# Patient Record
Sex: Female | Born: 1975
Health system: Southern US, Community
[De-identification: ages and names within clinical notes are randomized; demographics above are authoritative.]

## PROBLEM LIST (undated history)

## (undated) DIAGNOSIS — Z720 Tobacco use: Secondary | ICD-10-CM

## (undated) DIAGNOSIS — I10 Essential (primary) hypertension: Secondary | ICD-10-CM

## (undated) DIAGNOSIS — I639 Cerebral infarction, unspecified: Secondary | ICD-10-CM

## (undated) HISTORY — PX: WISDOM TOOTH EXTRACTION: SHX21

---

## 2002-05-26 ENCOUNTER — Inpatient Hospital Stay (HOSPITAL_COMMUNITY): Admission: AD | Admit: 2002-05-26 | Discharge: 2002-05-26 | Payer: Self-pay | Admitting: Obstetrics and Gynecology

## 2003-04-18 ENCOUNTER — Inpatient Hospital Stay (HOSPITAL_COMMUNITY): Admission: AD | Admit: 2003-04-18 | Discharge: 2003-04-18 | Payer: Self-pay | Admitting: Obstetrics & Gynecology

## 2003-07-06 ENCOUNTER — Encounter: Admission: RE | Admit: 2003-07-06 | Discharge: 2003-07-06 | Payer: Self-pay | Admitting: *Deleted

## 2003-07-07 ENCOUNTER — Ambulatory Visit (HOSPITAL_COMMUNITY): Admission: RE | Admit: 2003-07-07 | Discharge: 2003-07-07 | Payer: Self-pay | Admitting: *Deleted

## 2003-07-07 IMAGING — US US OB COMP +14 WK
1 series · 7 of 7 positions shown · non-contrast
Comparison: none

CLINICAL DATA: Anatomic exam.  No current problems.

[Series 1: unknown · 0.14mm/px · 7 of 7 slices shown]
[im 1/7]
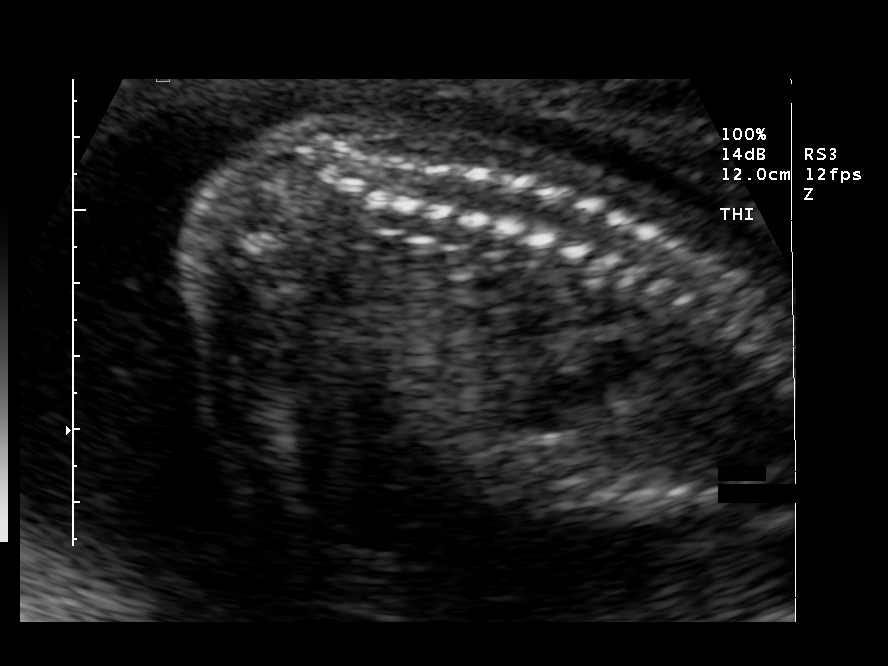
[im 2/7]
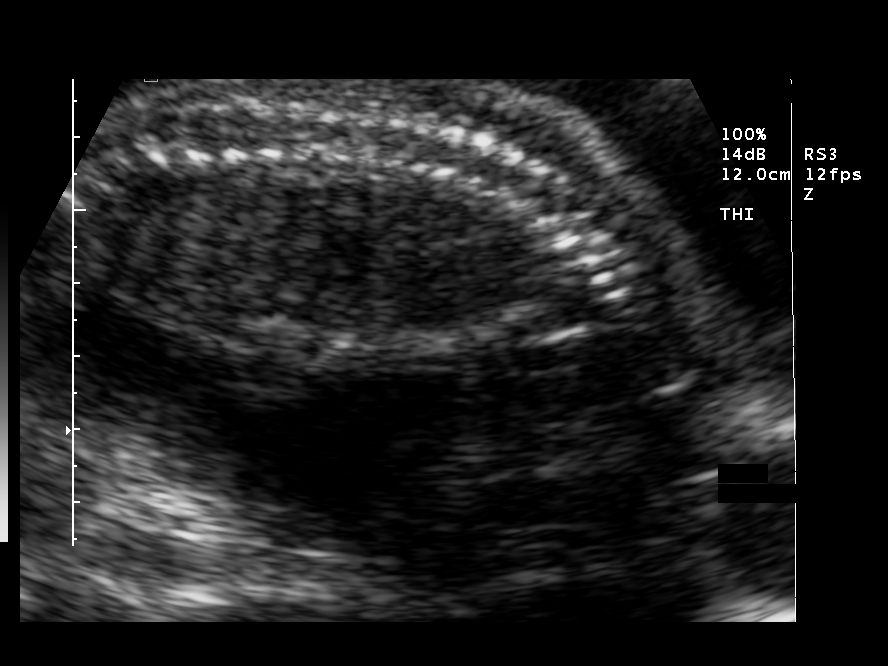
[im 3/7]
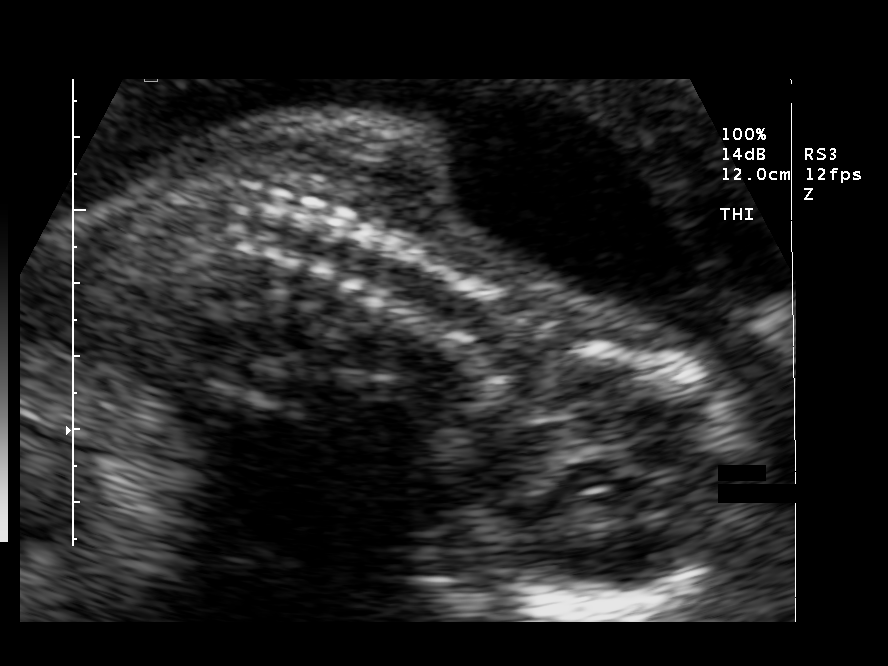
[im 4/7]
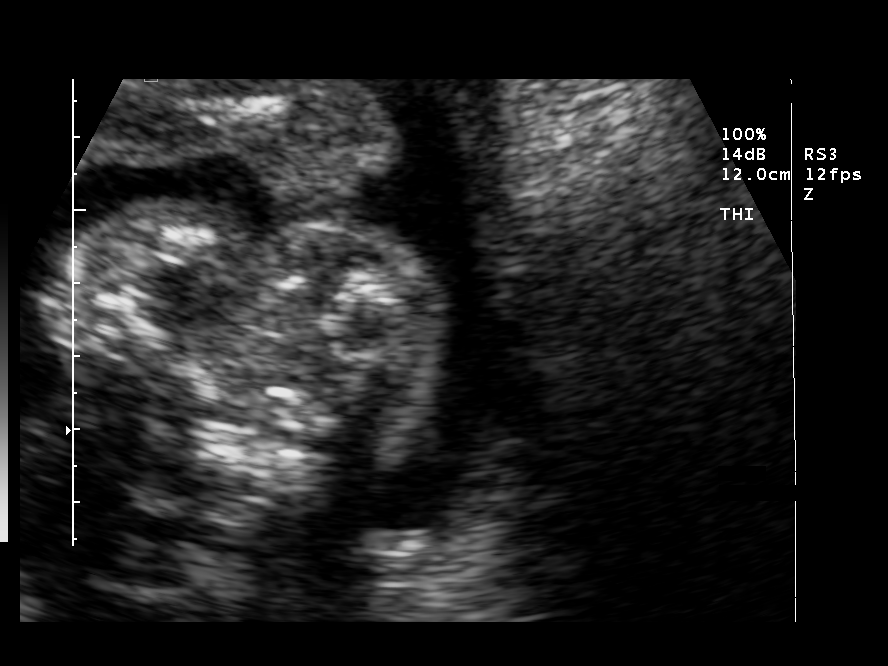
[im 5/7]
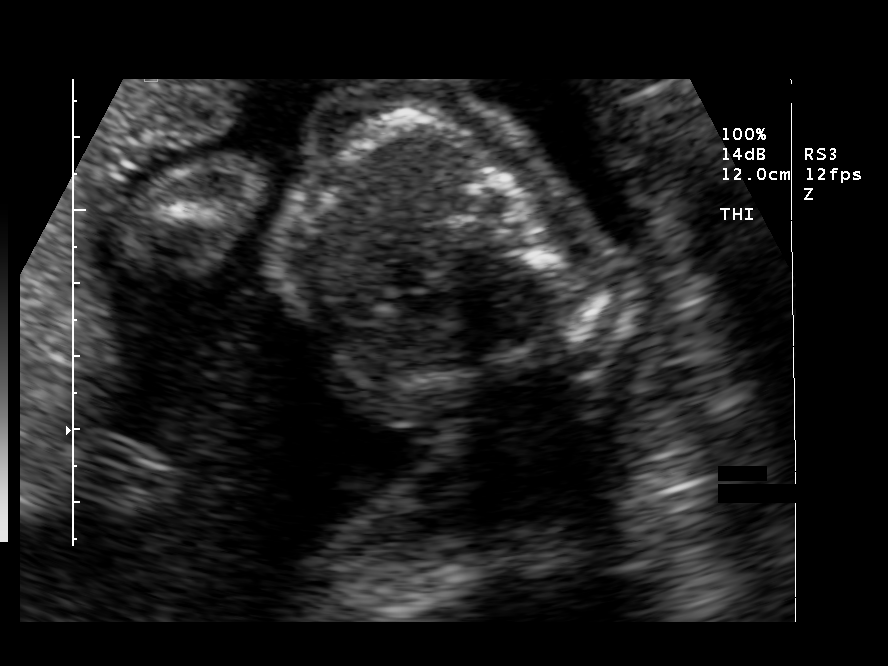
[im 6/7]
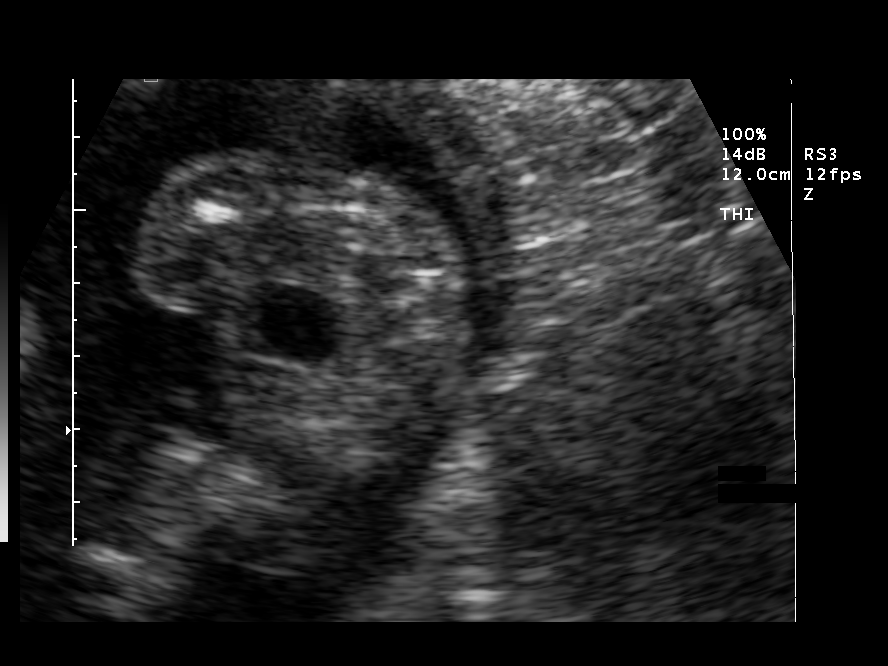
[im 7/7]
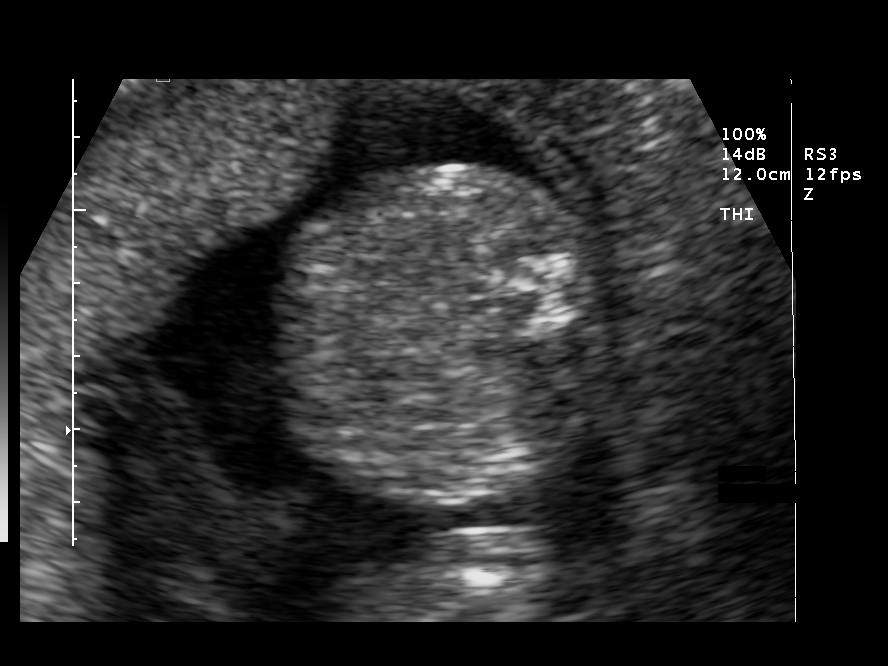

[7 of 7 positions shown; findings below may reference images not displayed]

OBSTETRICAL ULTRASOUND
Number of Fetuses:  1
Heart Rate:  147
Movement:  Yes
Breathing:    No  
Presentation:  Cephalic
Placental Location:  Anterior
Grade:  I
Previa:  No
Amniotic Fluid (Subjective):  Normal
Amniotic Fluid (Objective):   5.3 cm Vertical pocket 

FETAL BIOMETRY
BPD:  4.8 cm   20 w 4 d
HC:  18.2 cm  20 w 4 d
AC:  15.5 cm  20 w 4 d
FL:    3.4 cm  20 w 4 d

MEAN GA:  20 w 4 d

FETAL ANATOMY
Lateral Ventricles:    Visualized 
Thalami/CSP:      Visualized 
Posterior Fossa:  Visualized 
Nuchal Region:    N/A
Spine:      Visualized 
4 Chamber Heart on Left:      Visualized 
Stomach on Left:      Visualized 
3 Vessel Cord:    Visualized 
Cord Insertion site:    Visualized 
Kidneys:  Visualized 
Bladder:  Visualized 
Extremities:      Visualized 

ADDITIONAL ANATOMY VISUALIZED:  LVOT, RVOT, upper lip, orbits, profile, diaphragm, heel, 5th digit, ductal arch, aortic arch, and male genitalia

MATERNAL FINDINGS
Cervix:   3.1 cm Transabdominally
IMPRESSION: Single intrauterine pregnancy demonstrating an estimated gestational age by ultrasound of 20 weeks and 4 days.  Correlation with assigned gestational age by LMP of 20 weeks and 6 days suggests appropriate growth.  
No focal fetal or placental abnormalities are noted with a good anatomic exam possible.  

</u12:p>

## 2003-07-07 IMAGING — US US OB COMP +14 WK
1 series · 13 of 28 positions shown · non-contrast
Comparison: none

CLINICAL DATA: Anatomic exam.  No current problems.

[Series 1: unknown · 0.28mm/px · 13 of 76 slices shown]
[im 3/76]
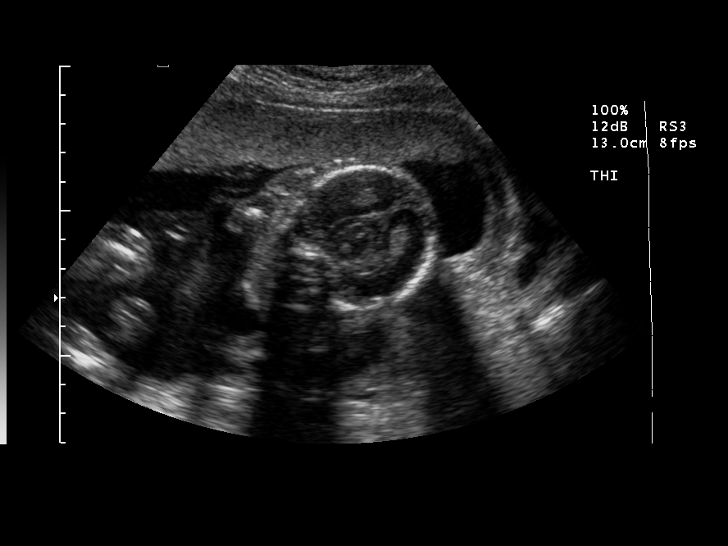
[im 9/76]
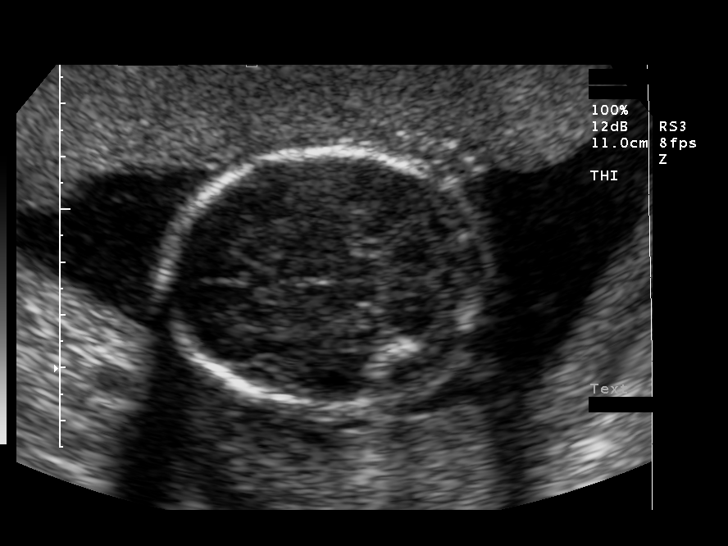
[im 14/76]
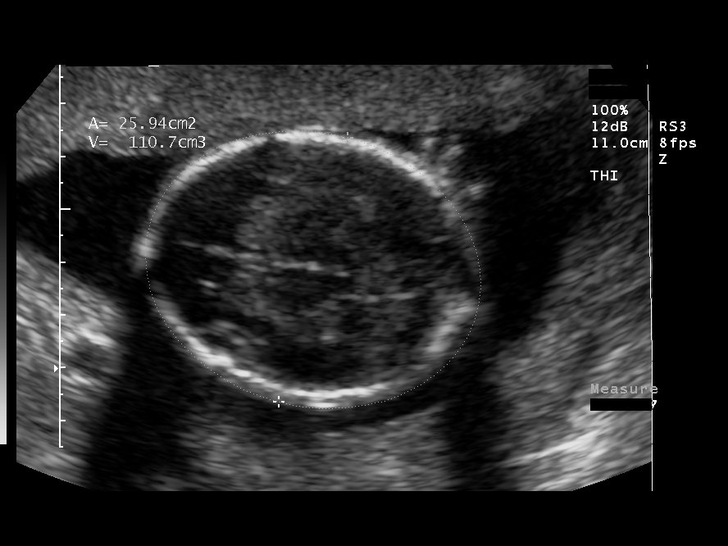
[im 20/76]
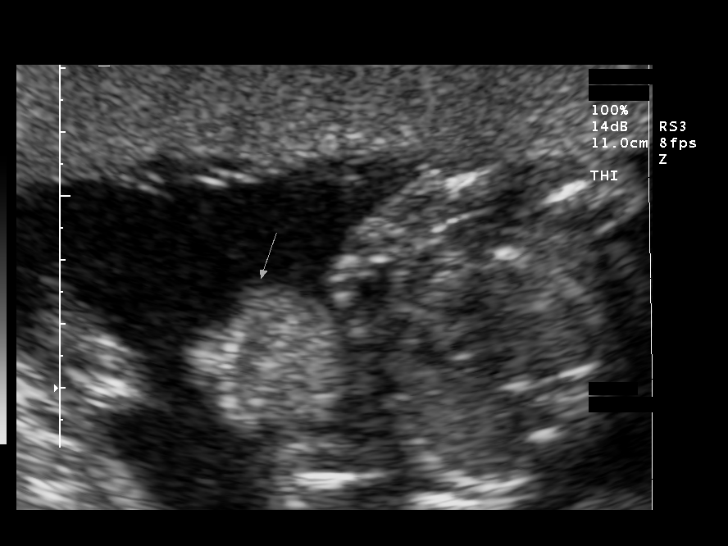
[im 26/76]
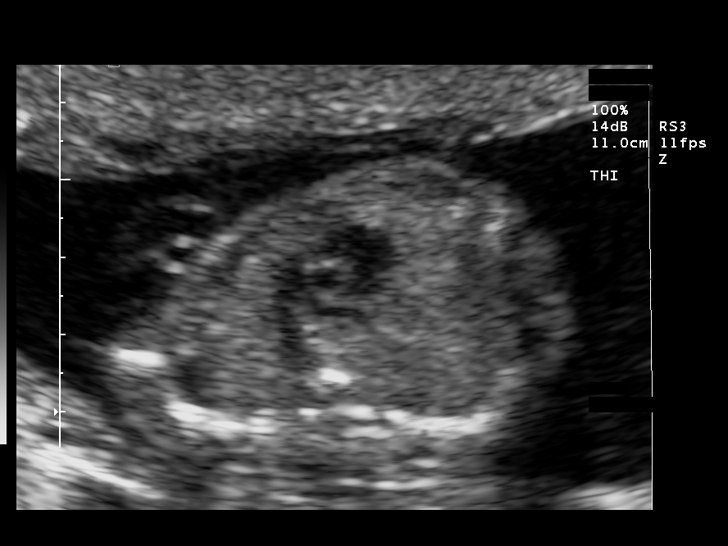
[im 31/76]
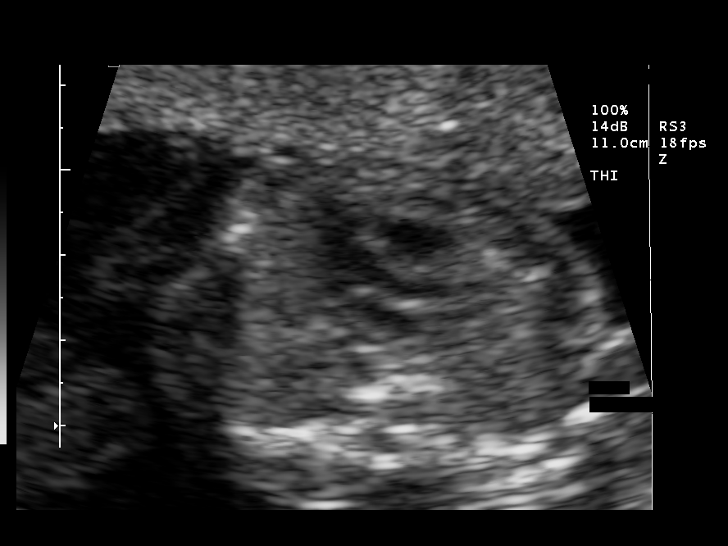
[im 39/76]
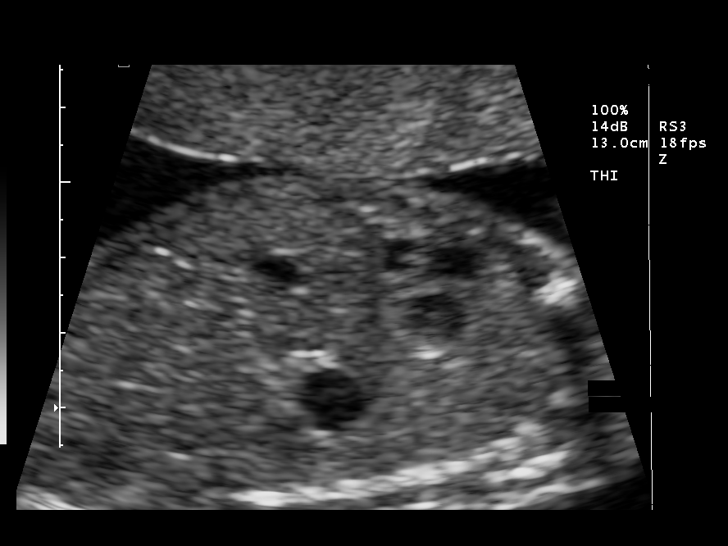
[im 45/76]
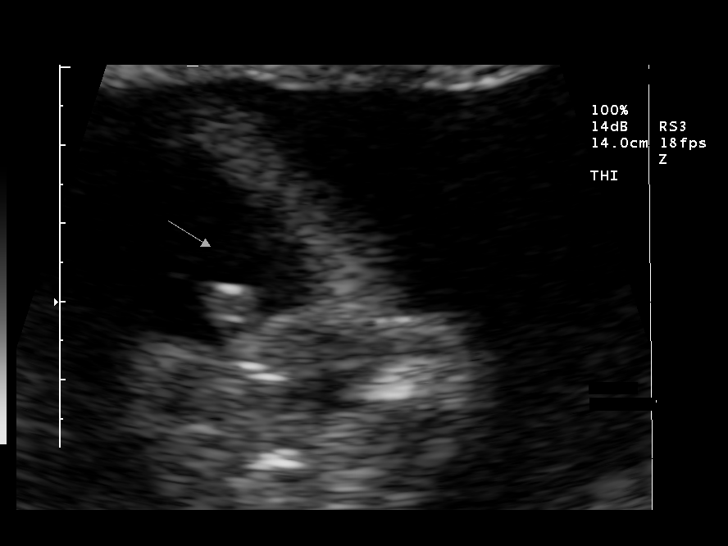
[im 51/76]
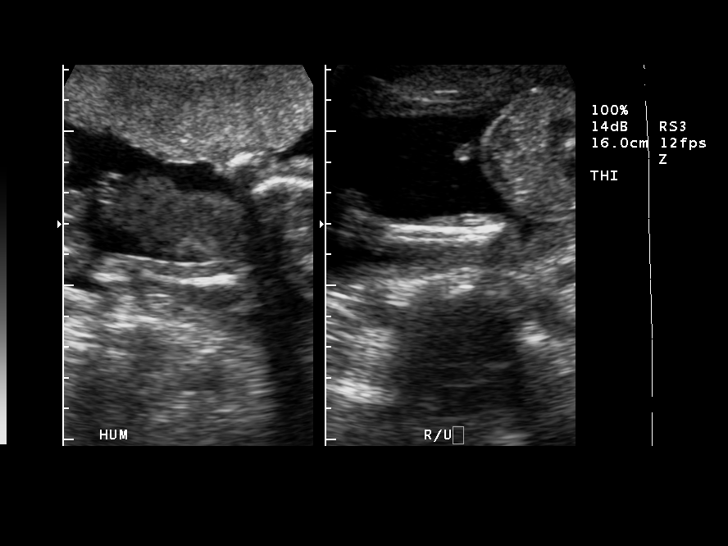
[im 56/76]
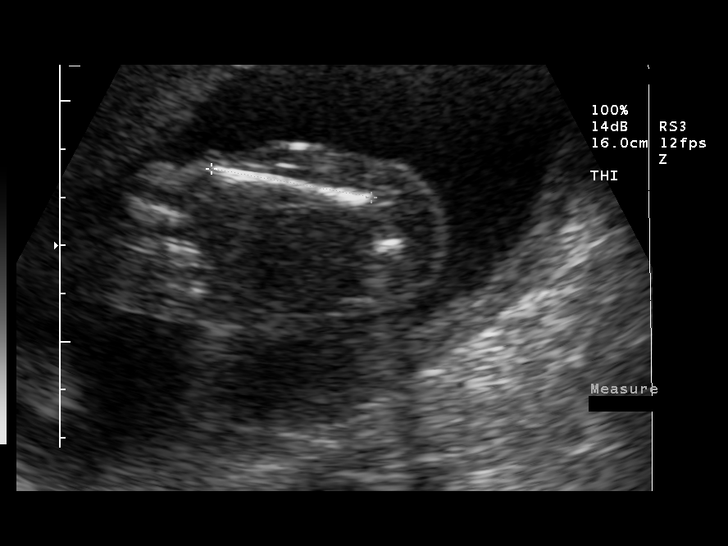
[im 62/76]
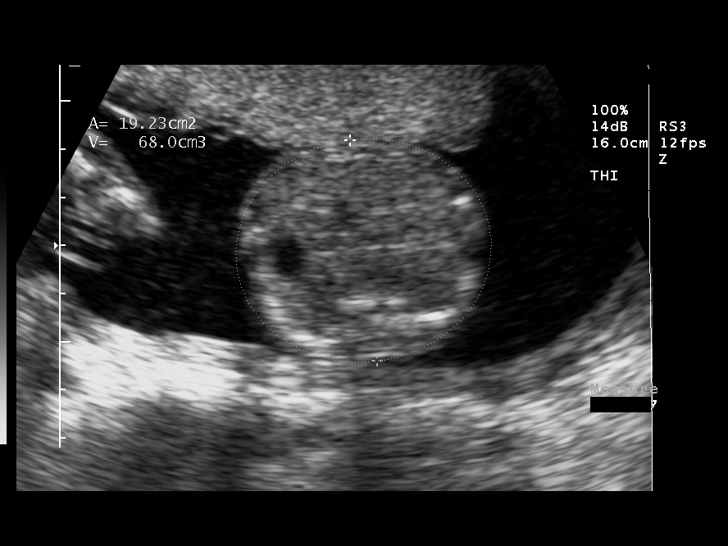
[im 67/76]
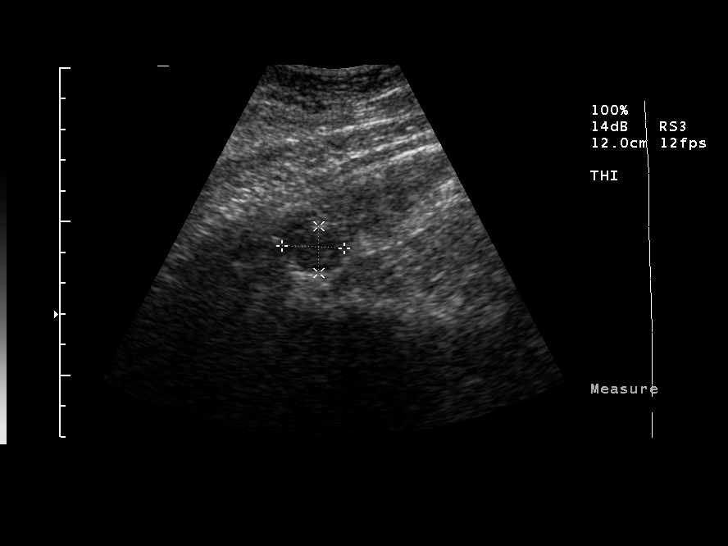
[im 73/76]
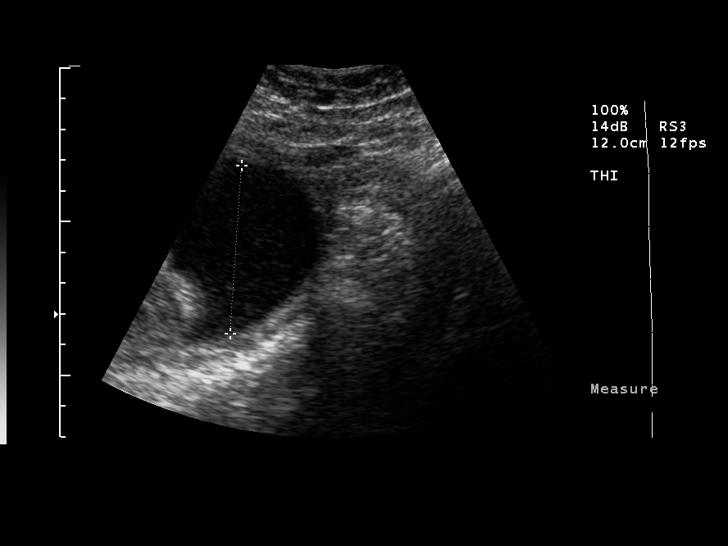

[13 of 28 positions shown; findings below may reference images not displayed]

OBSTETRICAL ULTRASOUND
Number of Fetuses:  1
Heart Rate:  147
Movement:  Yes
Breathing:    No  
Presentation:  Cephalic
Placental Location:  Anterior
Grade:  I
Previa:  No
Amniotic Fluid (Subjective):  Normal
Amniotic Fluid (Objective):   5.3 cm Vertical pocket 

FETAL BIOMETRY
BPD:  4.8 cm   20 w 4 d
HC:  18.2 cm  20 w 4 d
AC:  15.5 cm  20 w 4 d
FL:    3.4 cm  20 w 4 d

MEAN GA:  20 w 4 d

FETAL ANATOMY
Lateral Ventricles:    Visualized 
Thalami/CSP:      Visualized 
Posterior Fossa:  Visualized 
Nuchal Region:    N/A
Spine:      Visualized 
4 Chamber Heart on Left:      Visualized 
Stomach on Left:      Visualized 
3 Vessel Cord:    Visualized 
Cord Insertion site:    Visualized 
Kidneys:  Visualized 
Bladder:  Visualized 
Extremities:      Visualized 

ADDITIONAL ANATOMY VISUALIZED:  LVOT, RVOT, upper lip, orbits, profile, diaphragm, heel, 5th digit, ductal arch, aortic arch, and male genitalia

MATERNAL FINDINGS
Cervix:   3.1 cm Transabdominally
IMPRESSION: Single intrauterine pregnancy demonstrating an estimated gestational age by ultrasound of 20 weeks and 4 days.  Correlation with assigned gestational age by LMP of 20 weeks and 6 days suggests appropriate growth.  
No focal fetal or placental abnormalities are noted with a good anatomic exam possible.  

</u12:p>

## 2003-08-02 ENCOUNTER — Inpatient Hospital Stay (HOSPITAL_COMMUNITY): Admission: AD | Admit: 2003-08-02 | Discharge: 2003-08-02 | Payer: Self-pay | Admitting: *Deleted

## 2003-08-04 ENCOUNTER — Encounter: Admission: RE | Admit: 2003-08-04 | Discharge: 2003-08-04 | Payer: Self-pay | Admitting: *Deleted

## 2003-08-25 ENCOUNTER — Encounter: Admission: RE | Admit: 2003-08-25 | Discharge: 2003-08-25 | Payer: Self-pay | Admitting: Family Medicine

## 2003-08-31 ENCOUNTER — Encounter: Admission: RE | Admit: 2003-08-31 | Discharge: 2003-08-31 | Payer: Self-pay | Admitting: *Deleted

## 2003-09-21 ENCOUNTER — Ambulatory Visit: Payer: Self-pay | Admitting: *Deleted

## 2003-09-22 ENCOUNTER — Inpatient Hospital Stay (HOSPITAL_COMMUNITY): Admission: AD | Admit: 2003-09-22 | Discharge: 2003-09-22 | Payer: Self-pay | Admitting: *Deleted

## 2003-09-26 ENCOUNTER — Ambulatory Visit (HOSPITAL_COMMUNITY): Admission: RE | Admit: 2003-09-26 | Discharge: 2003-09-26 | Payer: Self-pay | Admitting: *Deleted

## 2003-09-26 IMAGING — US US OB FOLLOW-UP
1 series · 18 of 28 positions shown · non-contrast
Comparison: none

CLINICAL DATA: Assess growth.

[Series 1: us ob re-eval · 18 of 40 slices shown]
[im 1/40]
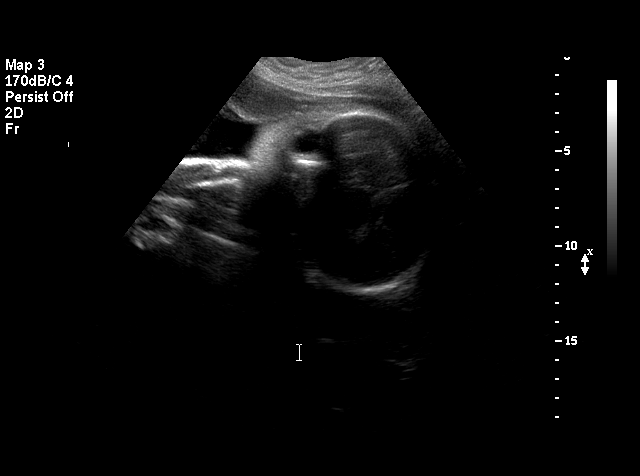
[im 3/40]
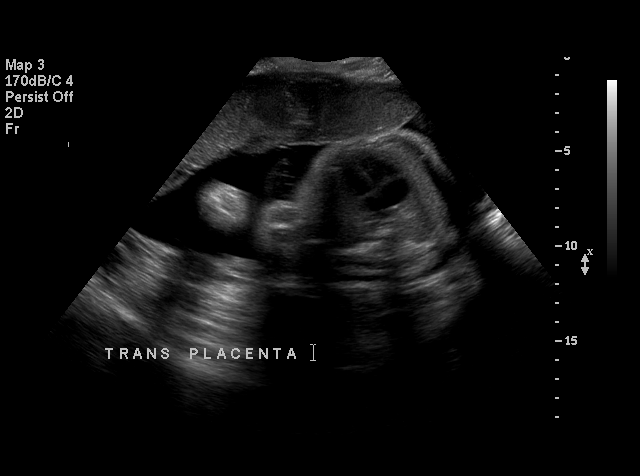
[im 5/40]
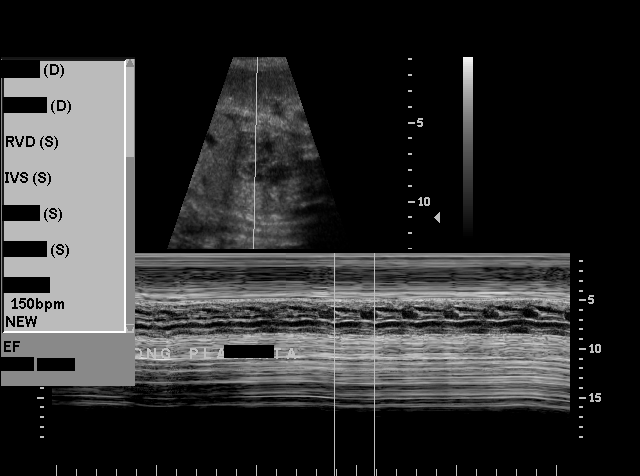
[im 8/40]
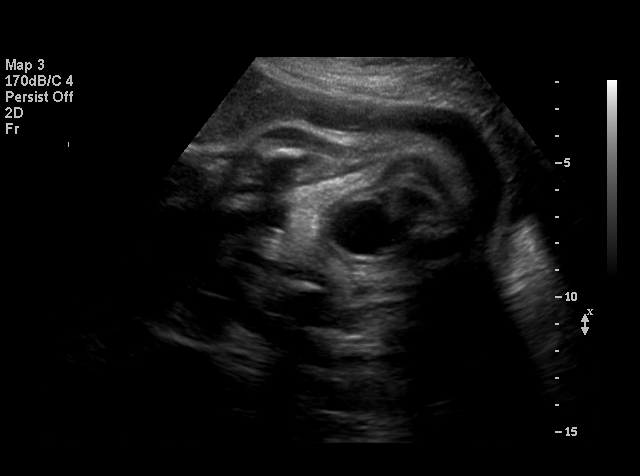
[im 11/40]
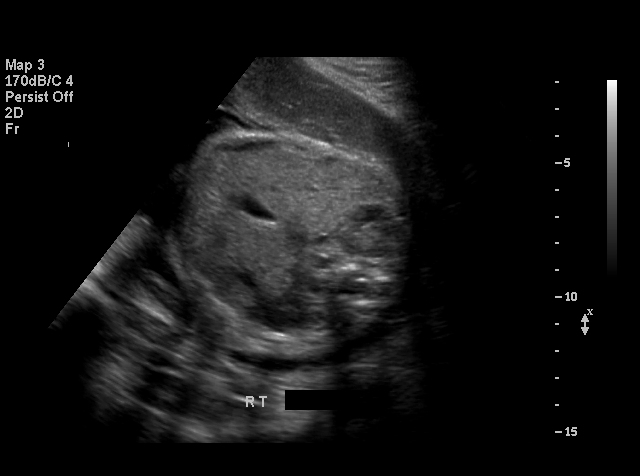
[im 12/40]
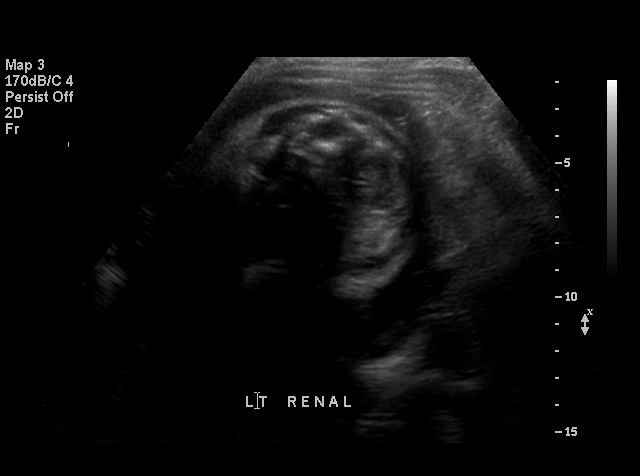
[im 15/40]
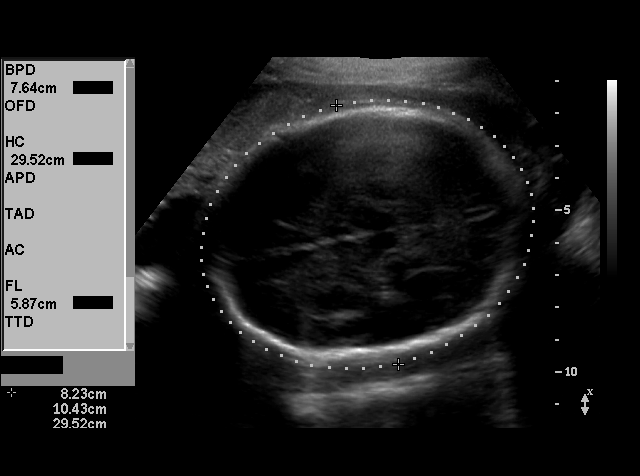
[im 16/40]
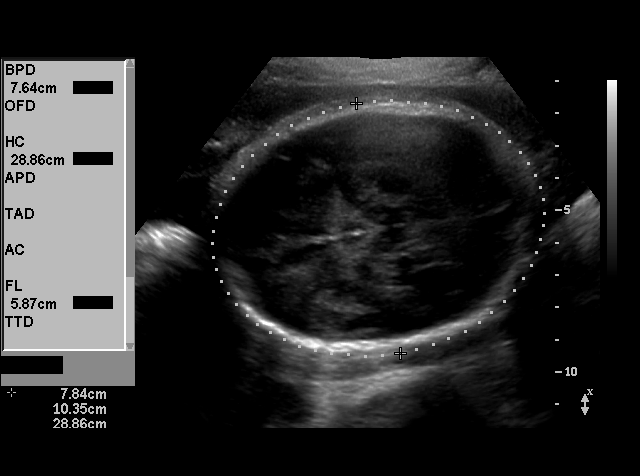
[im 19/40]
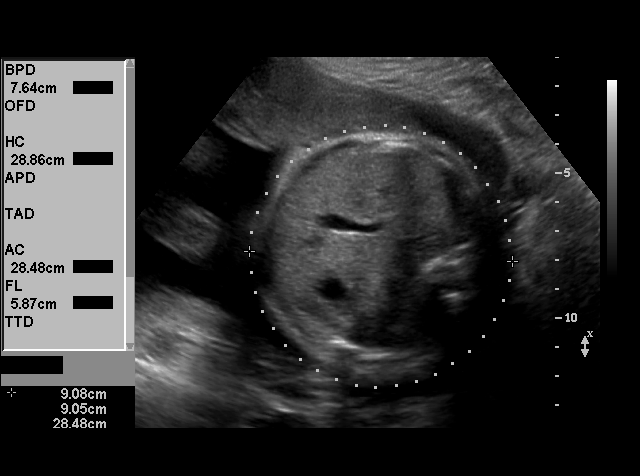
[im 21/40]
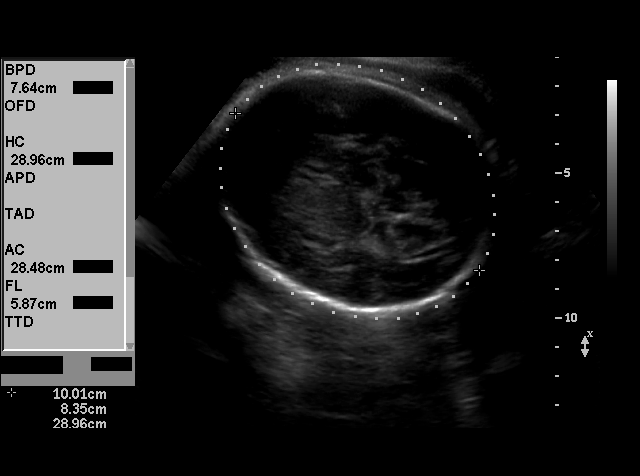
[im 24/40]
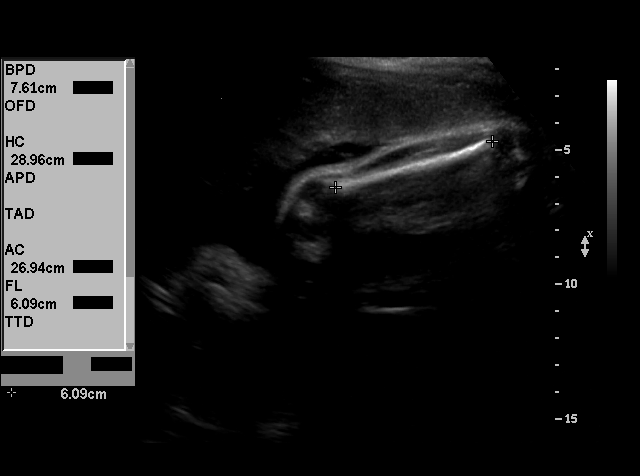
[im 25/40]
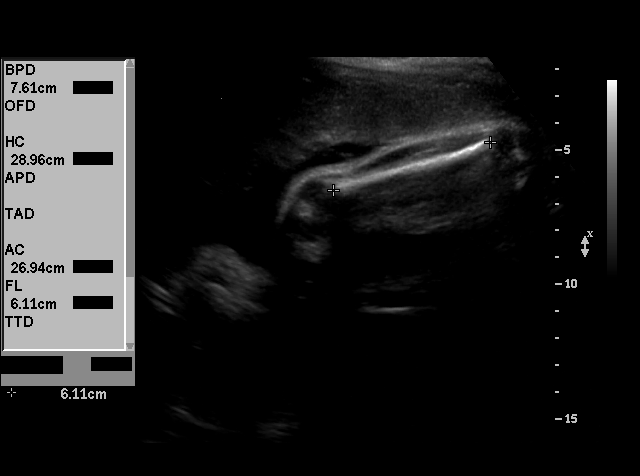
[im 28/40]
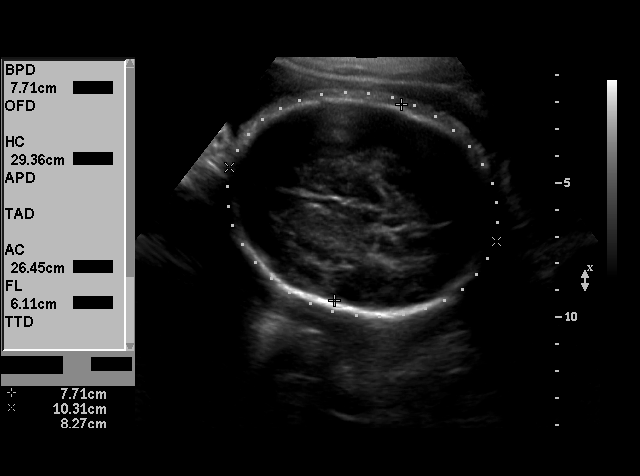
[im 31/40]
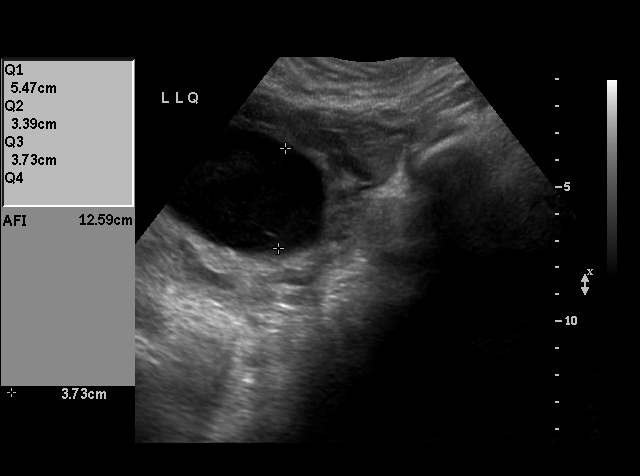
[im 32/40]
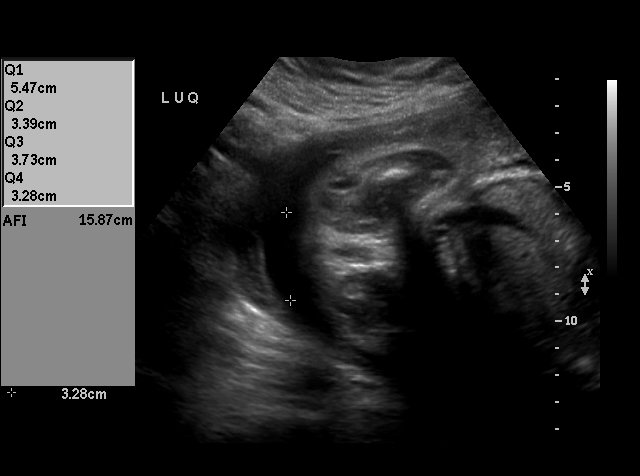
[im 35/40]
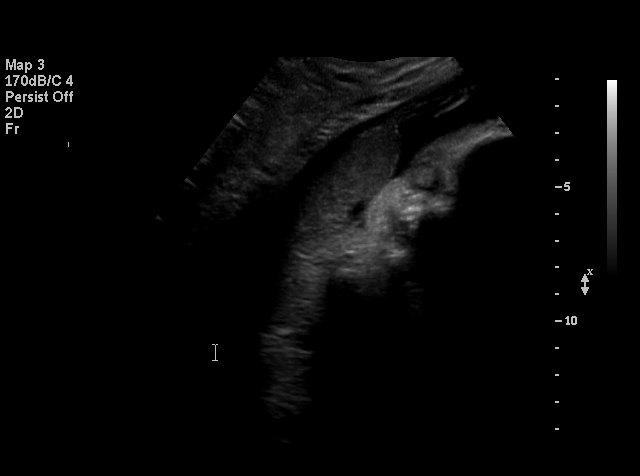
[im 37/40]
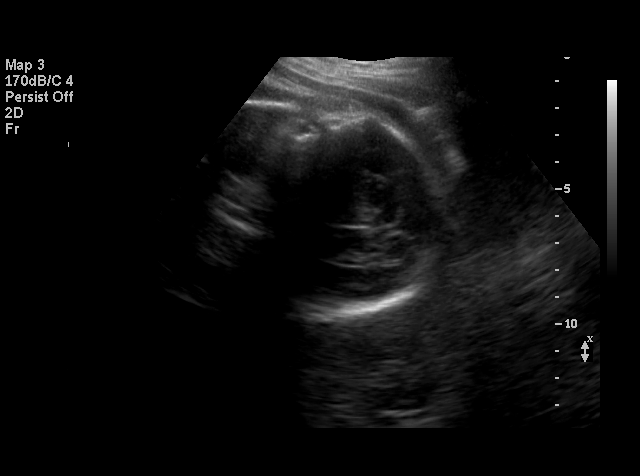
[im 40/40]
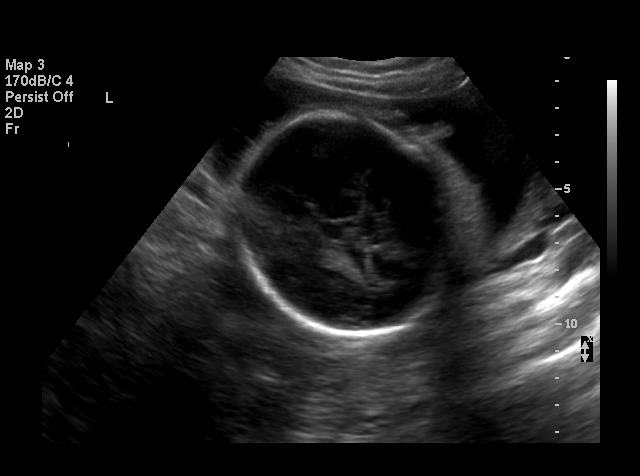

[18 of 28 positions shown; findings below may reference images not displayed]

OBSTETRICAL ULTRASOUND RE-EVALUATION
Number of Fetuses:  1
Heart Rate:  150
Movement:  Yes
Breathing:  No
Presentation:  Cephalic
Placental Location:  Anterior
Grade:  II
Previa:  No
Amniotic Fluid (subjective):  Normal
Amniotic Fluid (objective):  15.9 cm AFI (5th -95th%ile = 8.6 ? 24.2 cm for 32 wks)

FETAL BIOMETRY
BPD:  7.7 cm   30 w 6 d
HC:  29.0 cm   32 w 1 d
AC:  27.3 cm  31 w 3 d
FL:  6.0 cm  31 w 3 d

Mean GA:  31 w 4 d
Assigned GA:  32 w 3 d
Fetal indices are within normal limits.  
EFW:  [06] g (H) 50th ? 75th%ile ([06] ? [06] g) For 32 wks

FETAL ANATOMY
Lateral Ventricles:  Visualized 
Thalami/CSP:  Previously seen 
Posterior Fossa:  Previously seen 
Nuchal Region:  Previously seen 
Spine:  Previously seen 
4 Chamber Heart on Left:  Previously seen 
Stomach on Left:  Visualized 
3 Vessel Cord:  Previously seen 
Cord Insertion Site:  Previously seen 
Kidneys:  Visualized 
Bladder:  Visualized 
Extremities:  Previously seen 

MATERNAL FINDINGS
Cervix:  3.2 cm Transabdominally
IMPRESSION: Single intrauterine pregnancy demonstrating an estimated gestational age by ultrasound of 31 weeks and 4 days.  Correlation with assigned gestational age of 32 weeks and 3 days suggests appropriate growth with the estimated fetal weight just above the 50th percentile for a 32 week gestation.  
Subjectively and quantitatively normal amniotic fluid volume.
No late developing fetal anatomic abnormalities are identified associated with the lateral ventricles, stomach, kidneys, bladder or four chamber heart view.

## 2003-10-05 ENCOUNTER — Ambulatory Visit: Payer: Self-pay | Admitting: Obstetrics & Gynecology

## 2003-10-05 ENCOUNTER — Ambulatory Visit: Payer: Self-pay | Admitting: *Deleted

## 2003-10-05 ENCOUNTER — Observation Stay (HOSPITAL_COMMUNITY): Admission: AD | Admit: 2003-10-05 | Discharge: 2003-10-06 | Payer: Self-pay | Admitting: Obstetrics & Gynecology

## 2003-10-20 ENCOUNTER — Ambulatory Visit: Payer: Self-pay | Admitting: Family Medicine

## 2003-11-12 ENCOUNTER — Inpatient Hospital Stay (HOSPITAL_COMMUNITY): Admission: AD | Admit: 2003-11-12 | Discharge: 2003-11-12 | Payer: Self-pay | Admitting: *Deleted

## 2003-11-14 ENCOUNTER — Ambulatory Visit: Payer: Self-pay | Admitting: Obstetrics and Gynecology

## 2003-11-14 ENCOUNTER — Inpatient Hospital Stay (HOSPITAL_COMMUNITY): Admission: AD | Admit: 2003-11-14 | Discharge: 2003-11-15 | Payer: Self-pay | Admitting: Obstetrics and Gynecology

## 2005-03-23 ENCOUNTER — Emergency Department (HOSPITAL_COMMUNITY): Admission: EM | Admit: 2005-03-23 | Discharge: 2005-03-23 | Payer: Self-pay | Admitting: Emergency Medicine

## 2006-07-10 ENCOUNTER — Emergency Department (HOSPITAL_COMMUNITY): Admission: EM | Admit: 2006-07-10 | Discharge: 2006-07-10 | Payer: Self-pay | Admitting: Family Medicine

## 2006-12-29 ENCOUNTER — Inpatient Hospital Stay (HOSPITAL_COMMUNITY): Admission: AD | Admit: 2006-12-29 | Discharge: 2006-12-29 | Payer: Self-pay | Admitting: Gynecology

## 2006-12-29 IMAGING — US US OB COMP LESS 14 WK
1 series · 14 of 28 positions shown · non-contrast
Comparison: none

OBSTETRICAL ULTRASOUND:

 This ultrasound exam was performed in the [HOSPITAL] Ultrasound Department.  The OB US report was generated in the AS system, and faxed to the ordering physician.  This report is also available in [REDACTED] PACS.

[Series 1: us ob comp less 14 wk · 0.22mm/px · 14 of 51 slices shown]
[im 2/51]
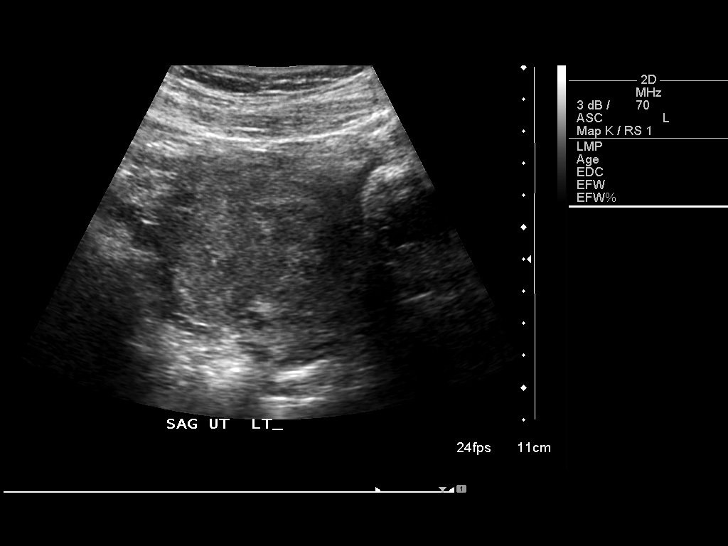
[im 6/51]
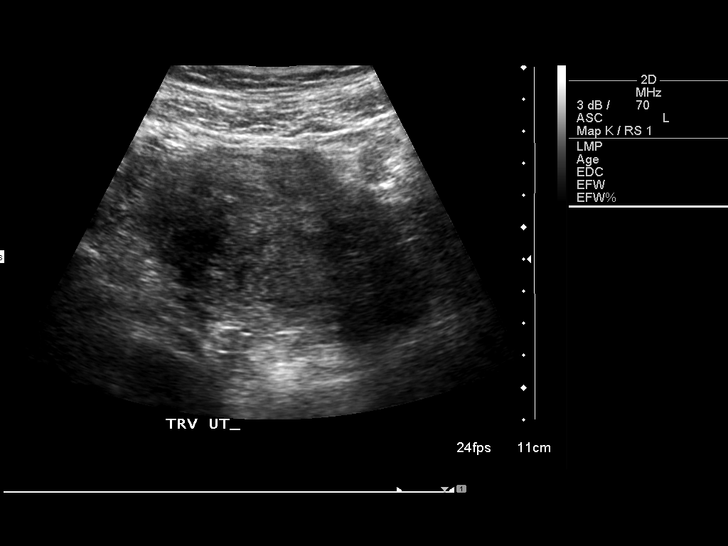
[im 10/51]
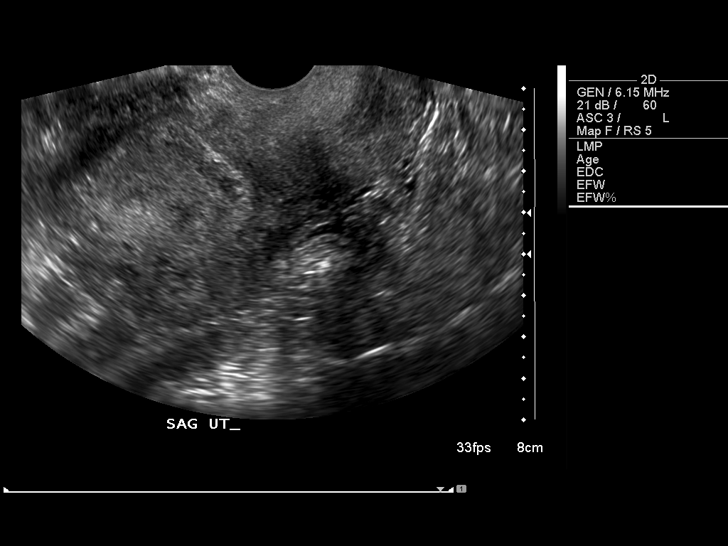
[im 13/51]
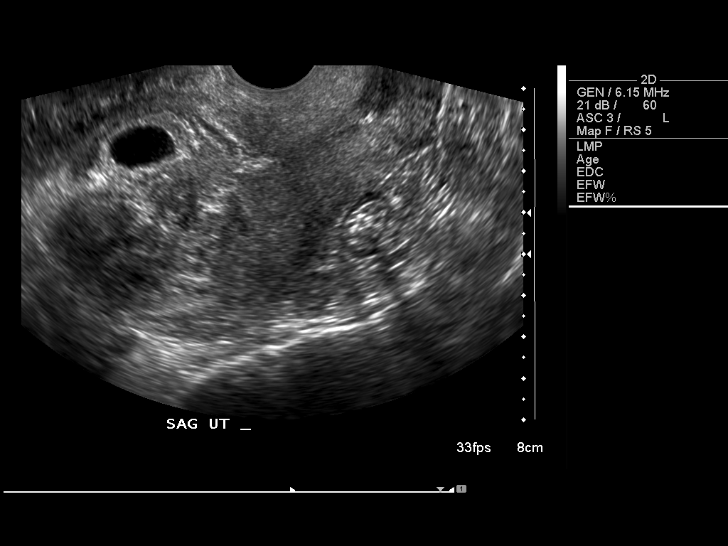
[im 17/51]
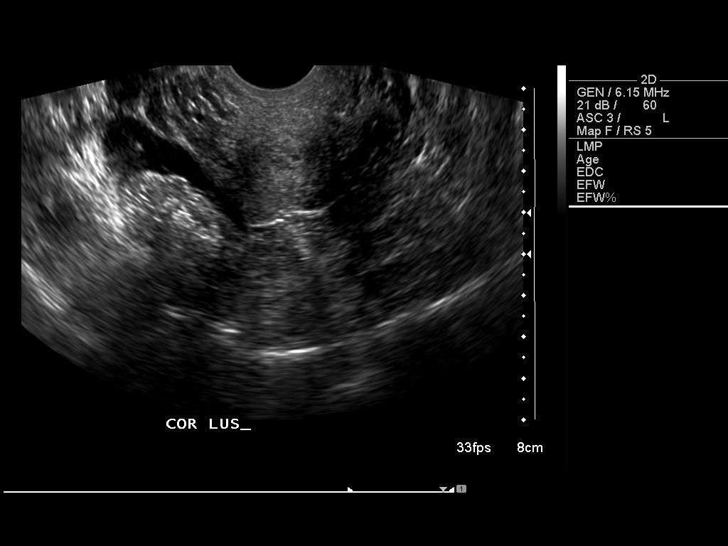
[im 21/51]
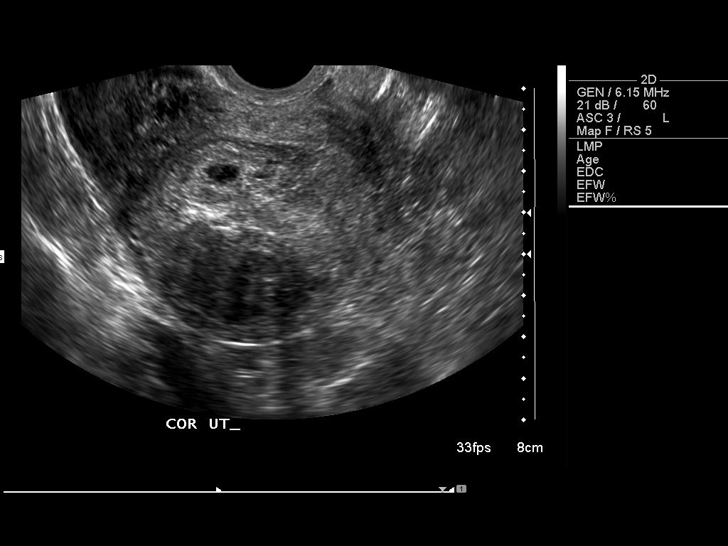
[im 25/51]
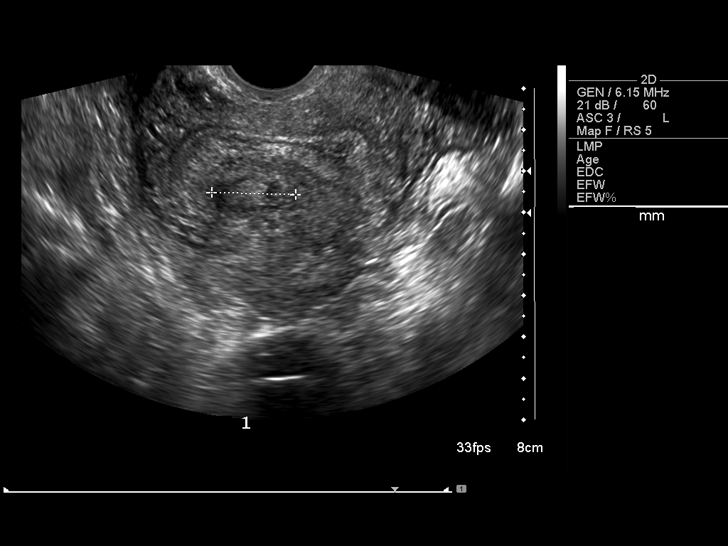
[im 28/51]
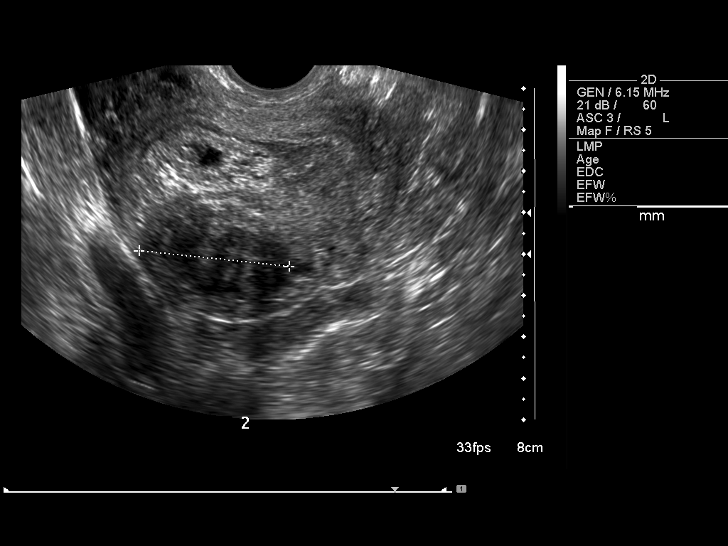
[im 32/51]
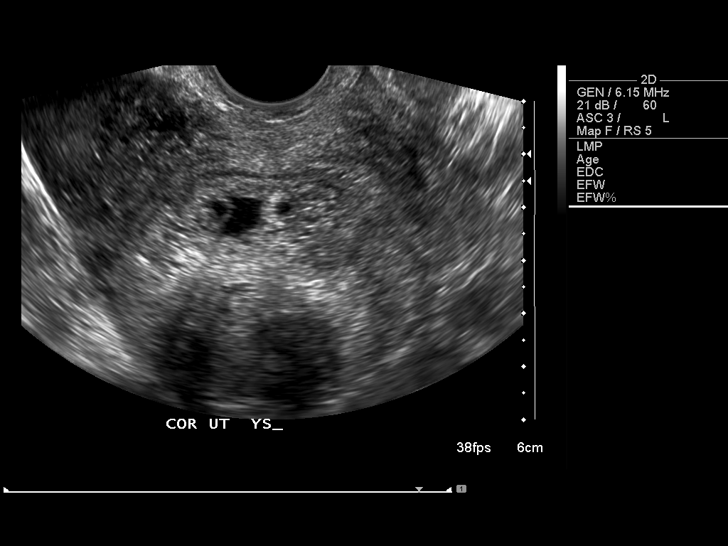
[im 36/51]
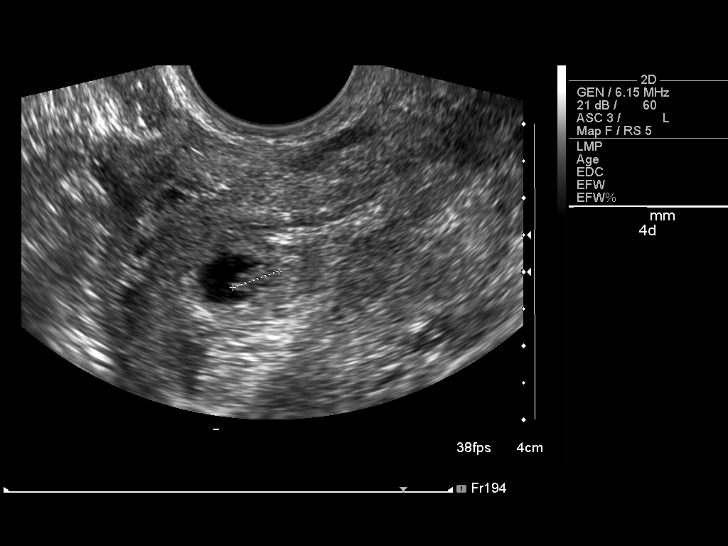
[im 39/51]
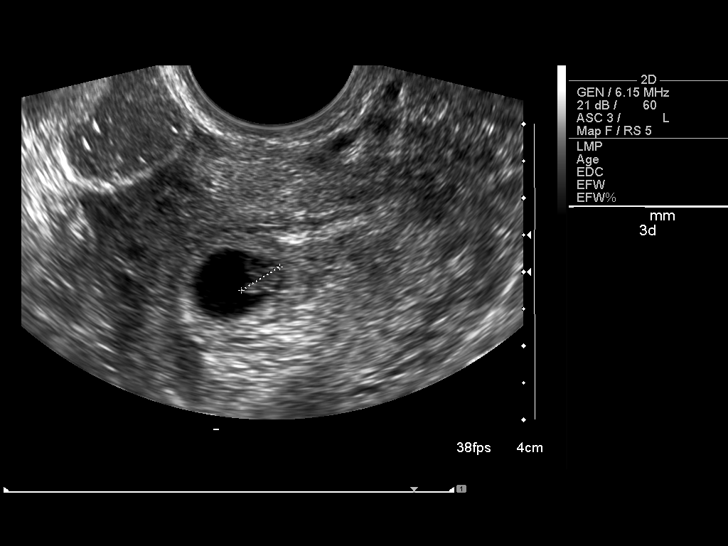
[im 43/51]
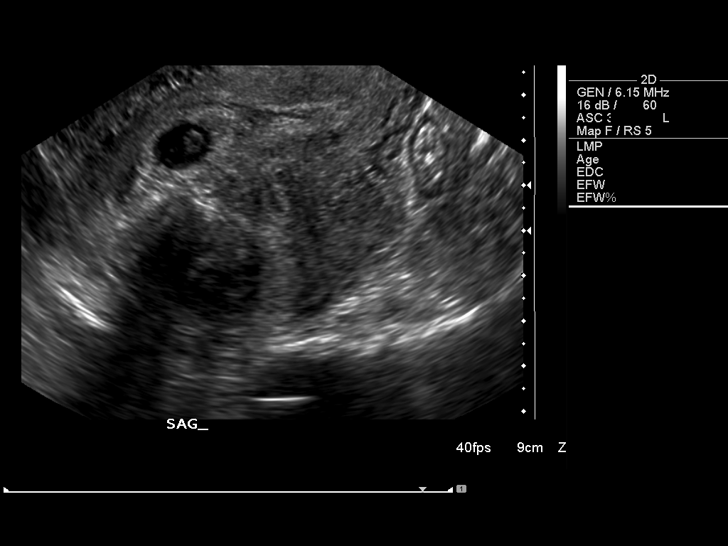
[im 47/51]
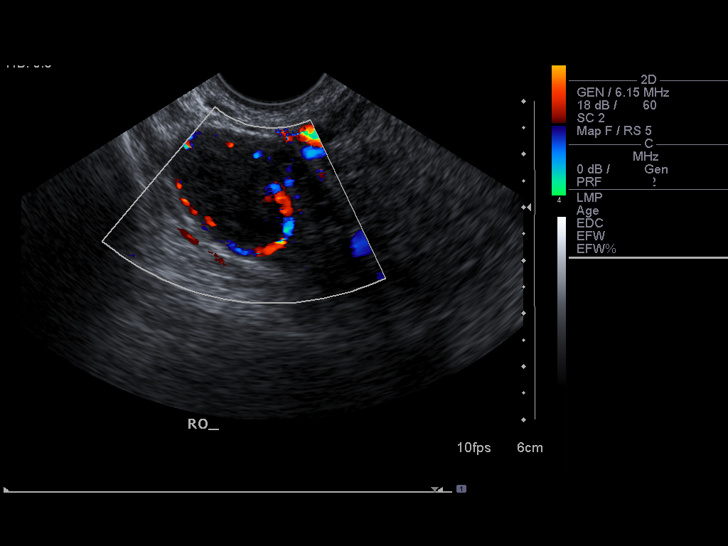
[im 51/51]
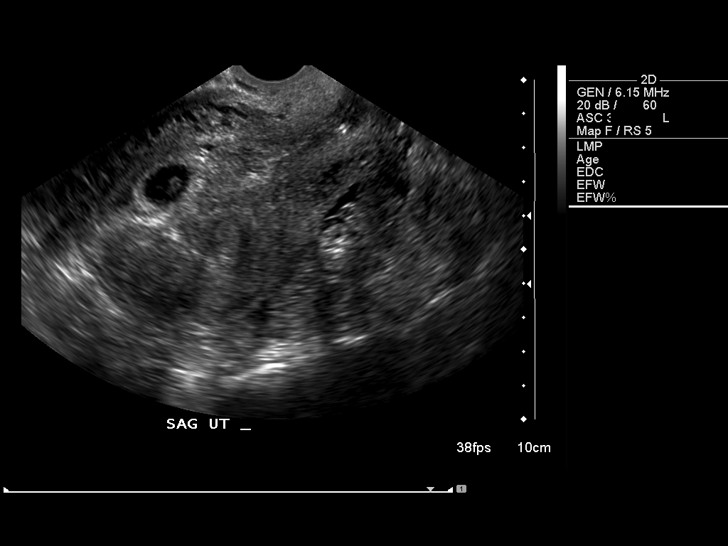

[14 of 28 positions shown; findings below may reference images not displayed]

IMPRESSION: See AS Obstetric US report.

## 2007-02-18 ENCOUNTER — Inpatient Hospital Stay (HOSPITAL_COMMUNITY): Admission: AD | Admit: 2007-02-18 | Discharge: 2007-02-18 | Payer: Self-pay | Admitting: Gynecology

## 2007-03-25 ENCOUNTER — Ambulatory Visit (HOSPITAL_COMMUNITY): Admission: RE | Admit: 2007-03-25 | Discharge: 2007-03-25 | Payer: Self-pay | Admitting: Family Medicine

## 2007-03-25 IMAGING — US US OB DETAIL+14 WK
1 series · 14 of 28 positions shown · non-contrast
Comparison: none

OBSTETRICAL ULTRASOUND:

 This ultrasound exam was performed in the [HOSPITAL] Ultrasound Department.  The OB US report was generated in the AS system, and faxed to the ordering physician.  This report is also available in [REDACTED] PACS.

[Series 1: us ob detail+14 wk · 0.28mm/px · 14 of 76 slices shown]
[im 3/76]
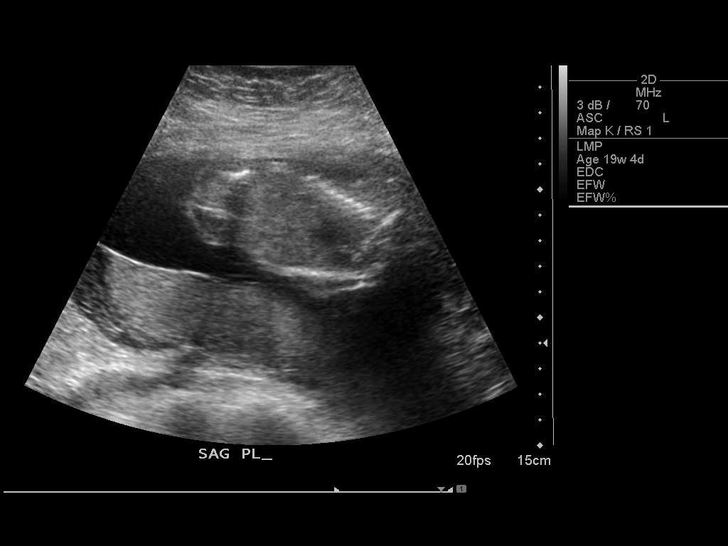
[im 9/76]
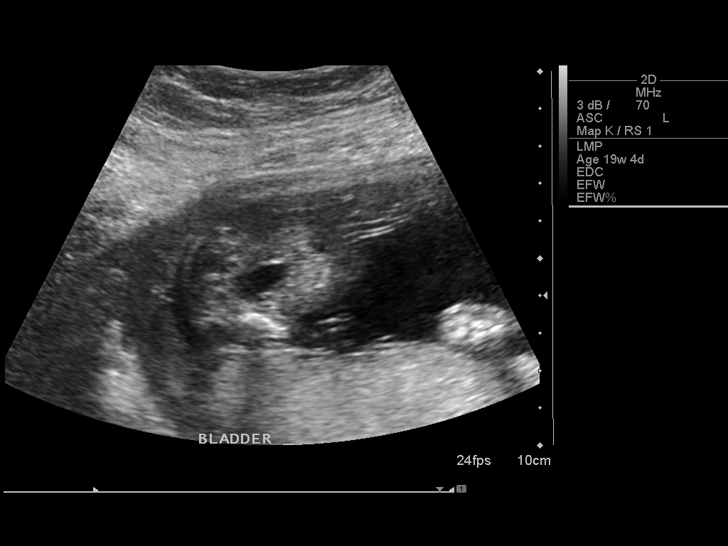
[im 14/76]
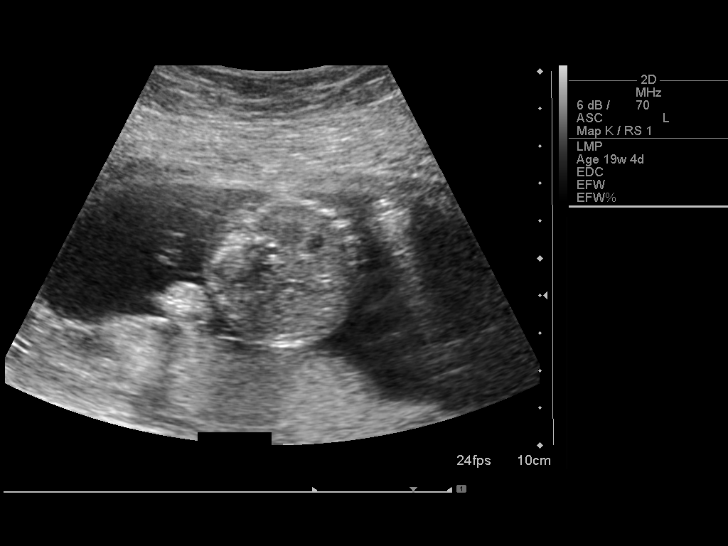
[im 20/76]
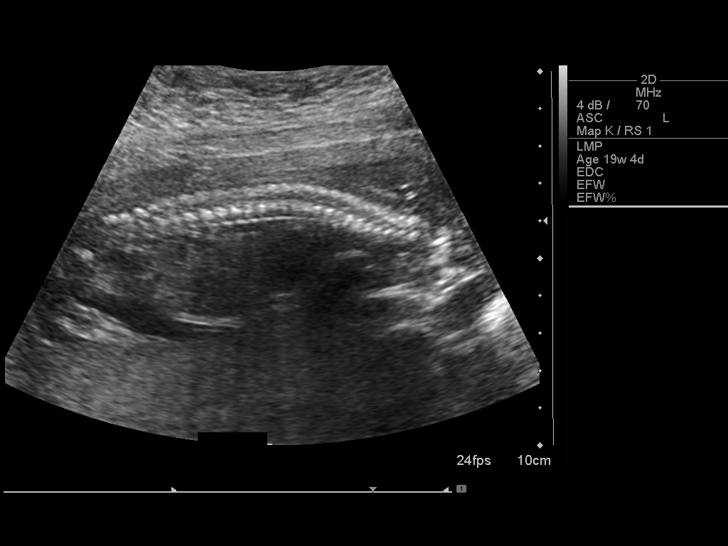
[im 26/76]
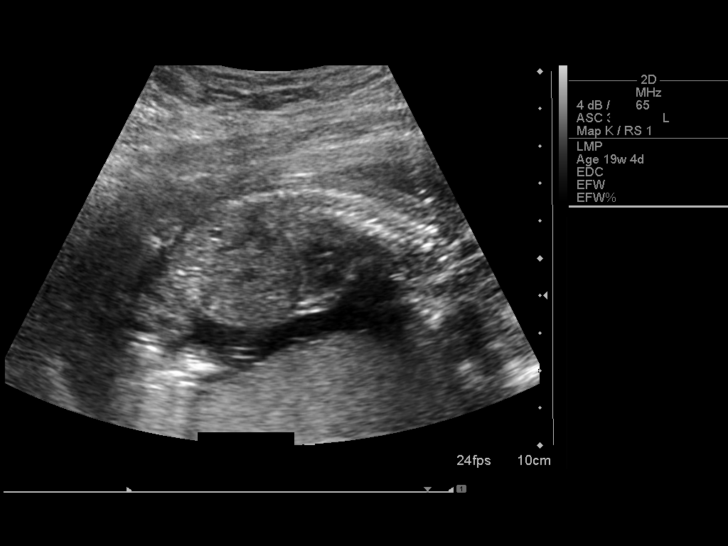
[im 31/76]
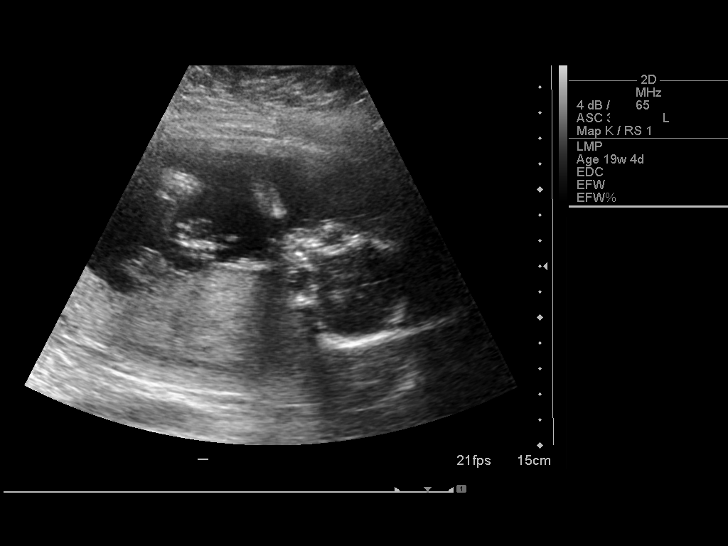
[im 37/76]
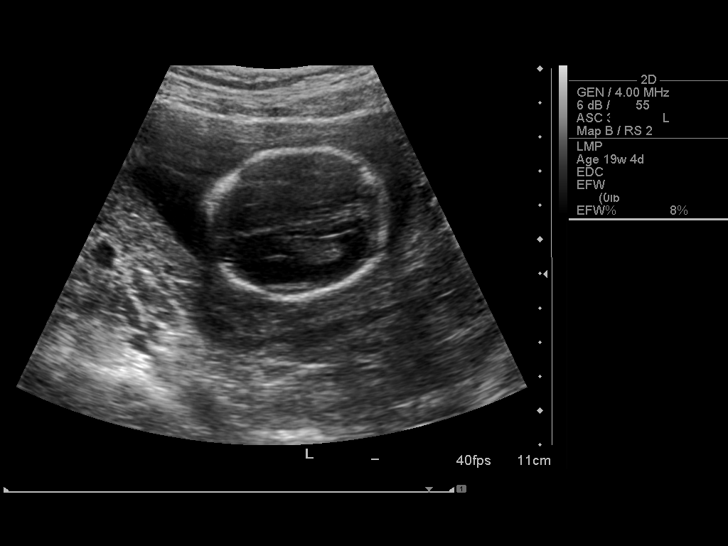
[im 42/76]
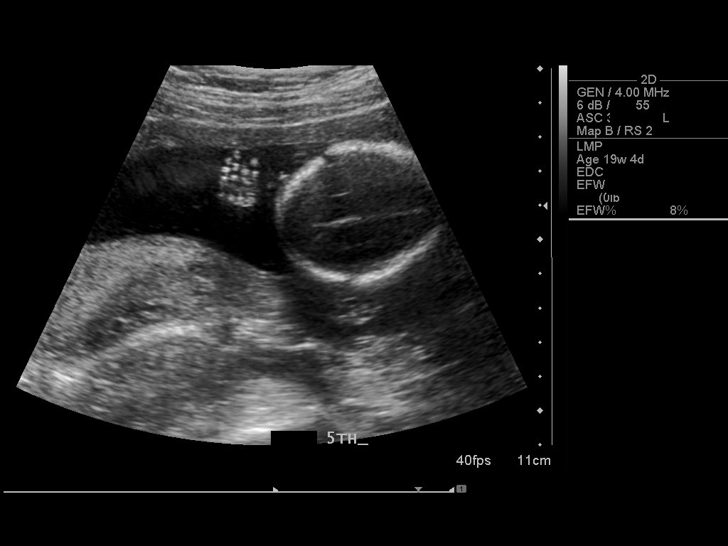
[im 48/76]
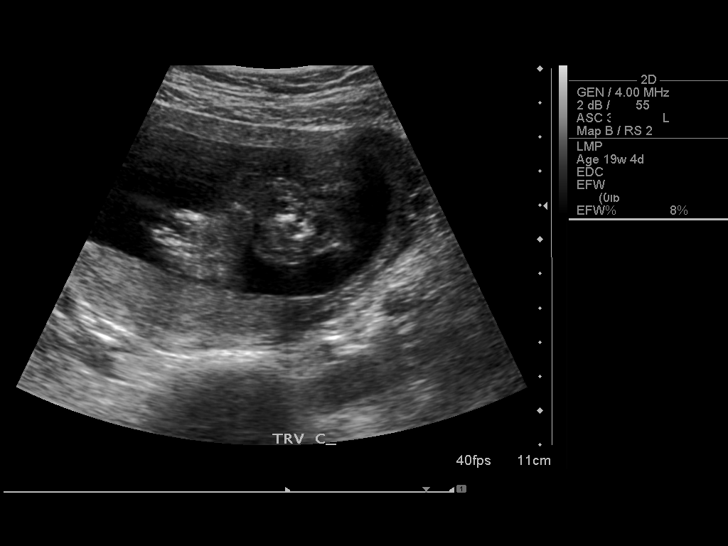
[im 53/76]
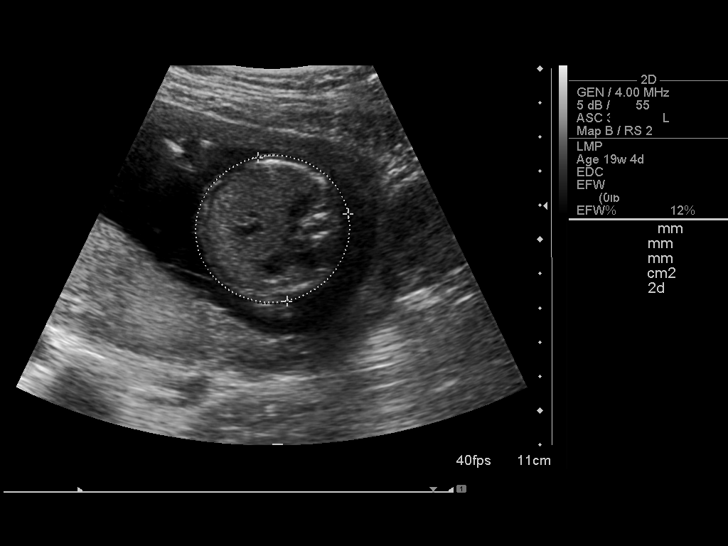
[im 59/76]
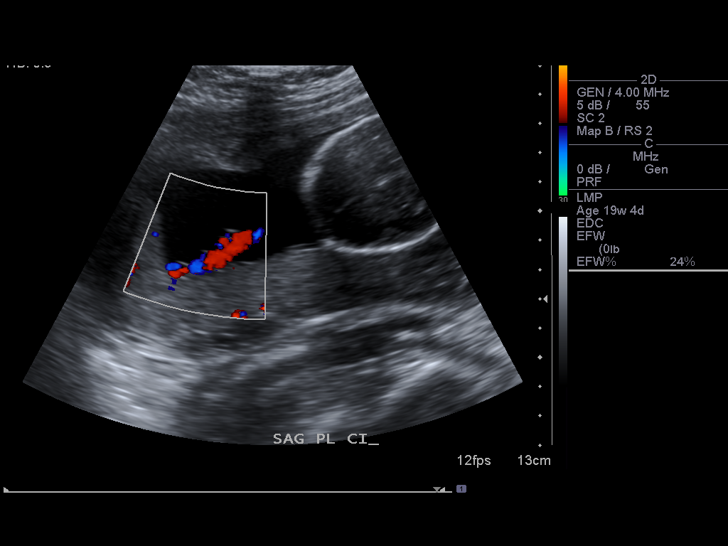
[im 64/76]
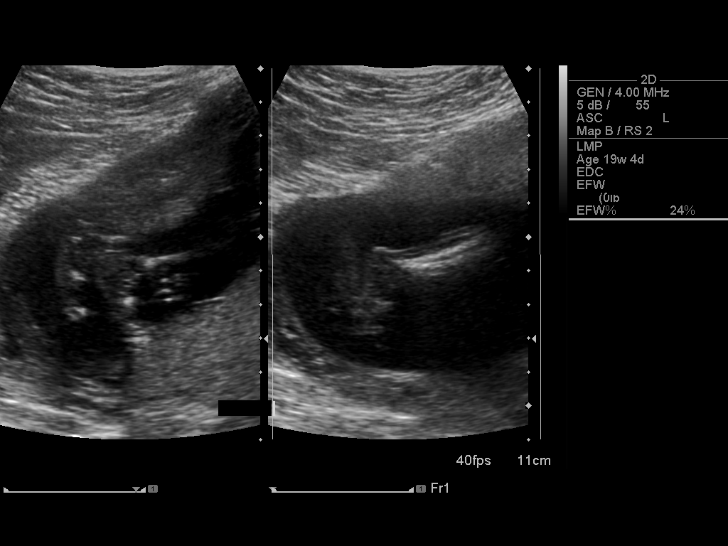
[im 70/76]
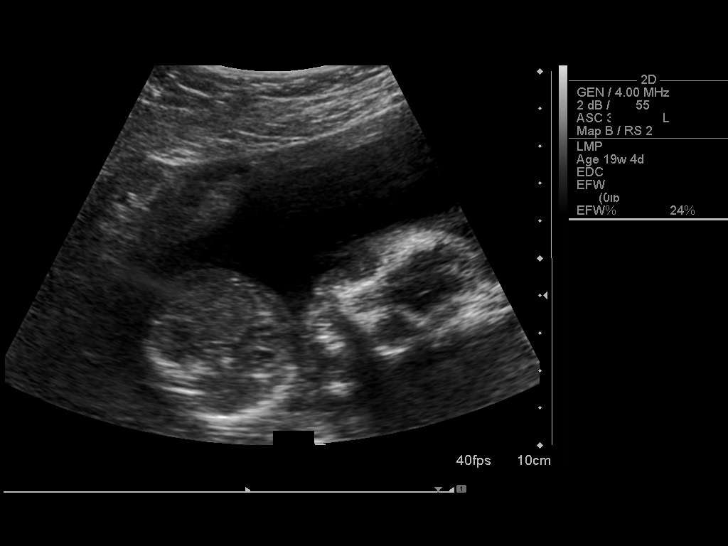
[im 76/76]
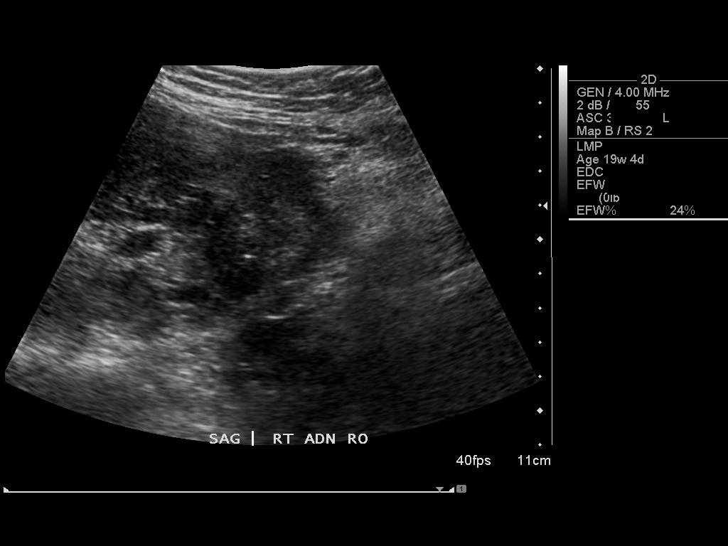

[14 of 28 positions shown; findings below may reference images not displayed]

IMPRESSION: See AS Obstetric US report.

## 2007-04-01 ENCOUNTER — Ambulatory Visit (HOSPITAL_COMMUNITY): Admission: RE | Admit: 2007-04-01 | Discharge: 2007-04-01 | Payer: Self-pay | Admitting: Obstetrics & Gynecology

## 2007-04-01 IMAGING — US US OB FOLLOW-UP
1 series · 14 of 28 positions shown · non-contrast
Comparison: none

OBSTETRICAL ULTRASOUND:
 This ultrasound exam was performed in the [HOSPITAL] Ultrasound Department.  The OB US report was generated in the AS system, and faxed to the ordering physician.  This report is also available in [REDACTED] PACS.

[Series 1: us ob follow-up · 0.18mm/px · 14 of 46 slices shown]
[im 2/46]
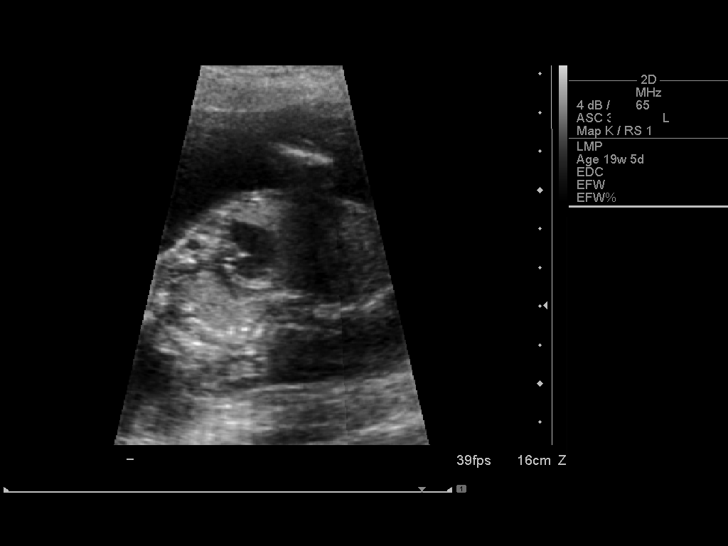
[im 6/46]
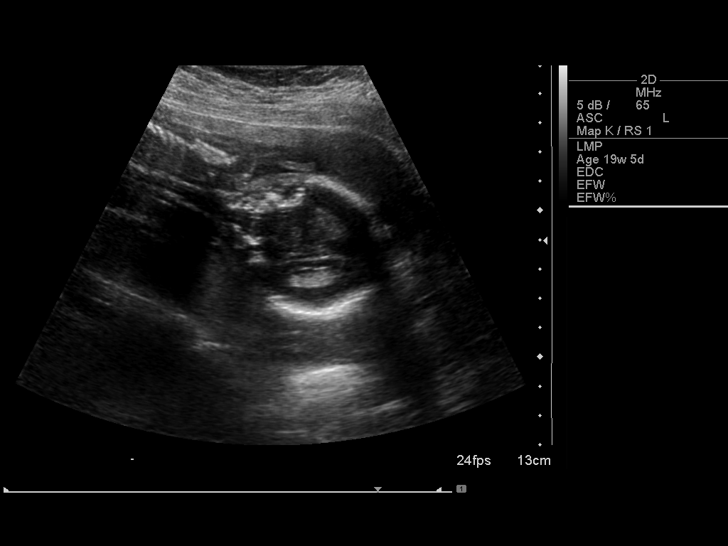
[im 9/46]
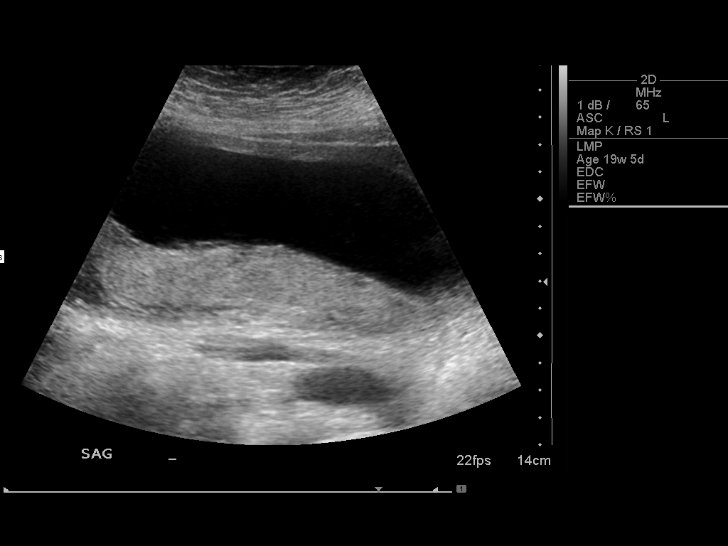
[im 12/46]
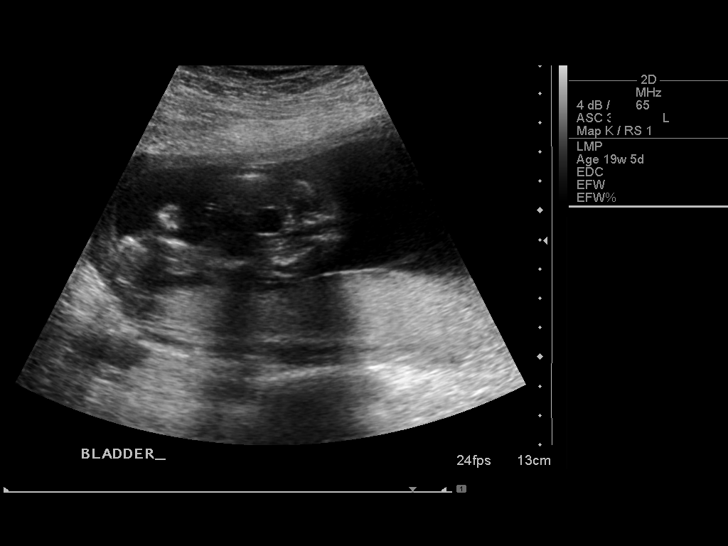
[im 16/46]
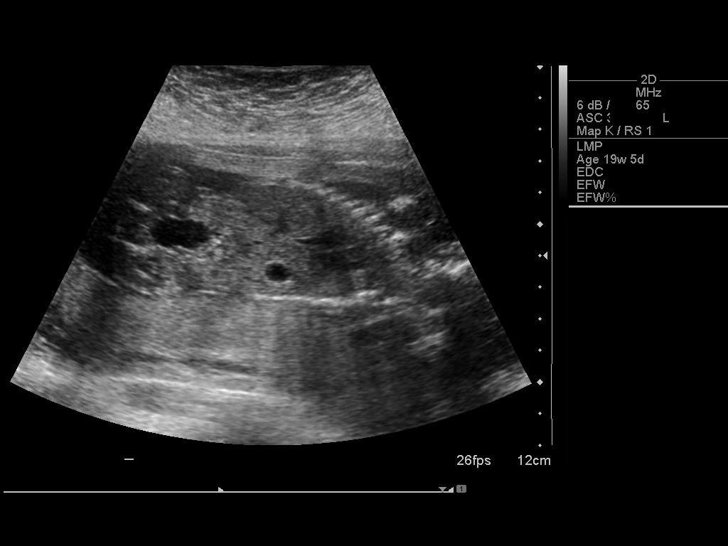
[im 19/46]
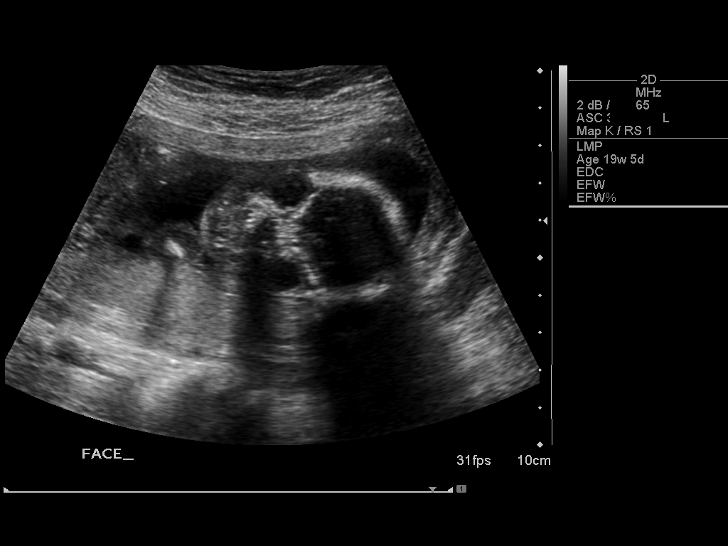
[im 22/46]
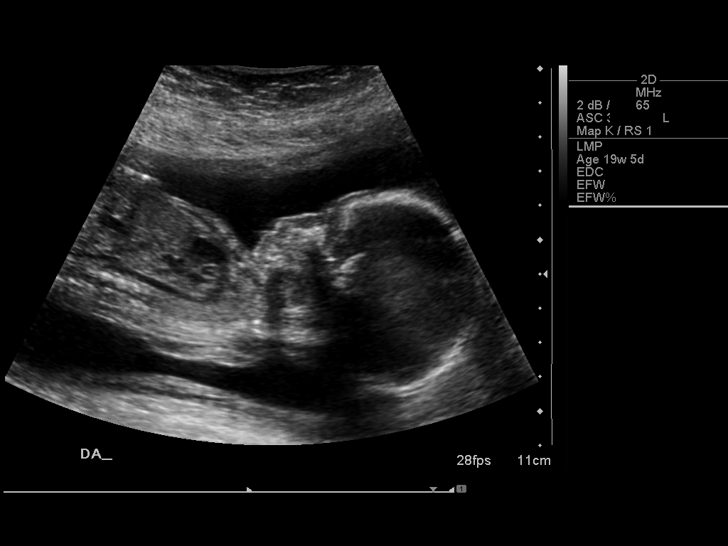
[im 26/46]
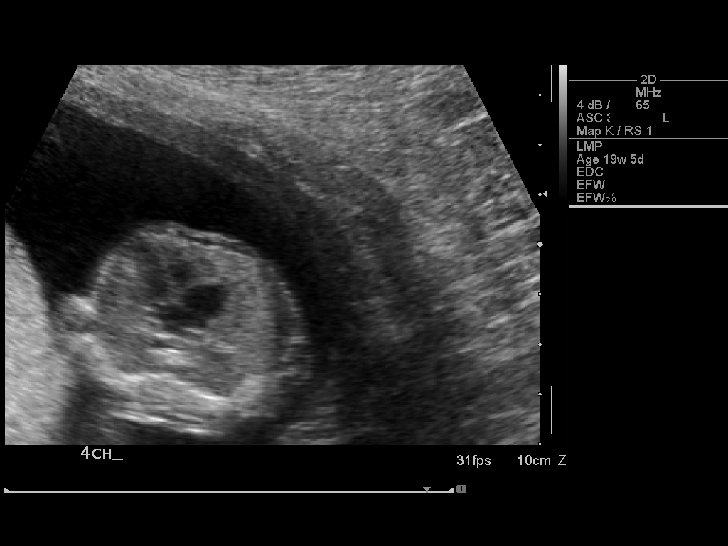
[im 29/46]
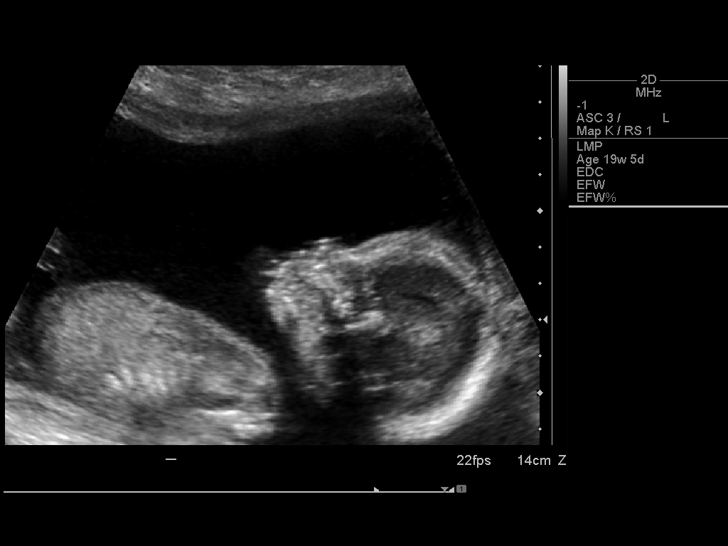
[im 32/46]
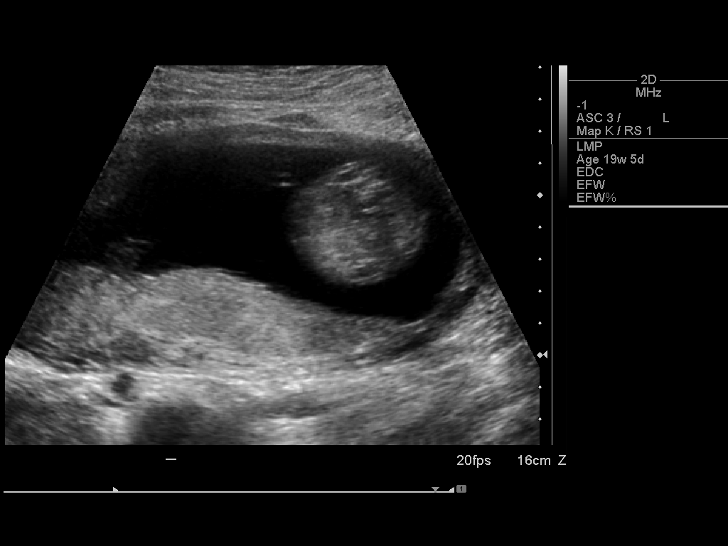
[im 36/46]
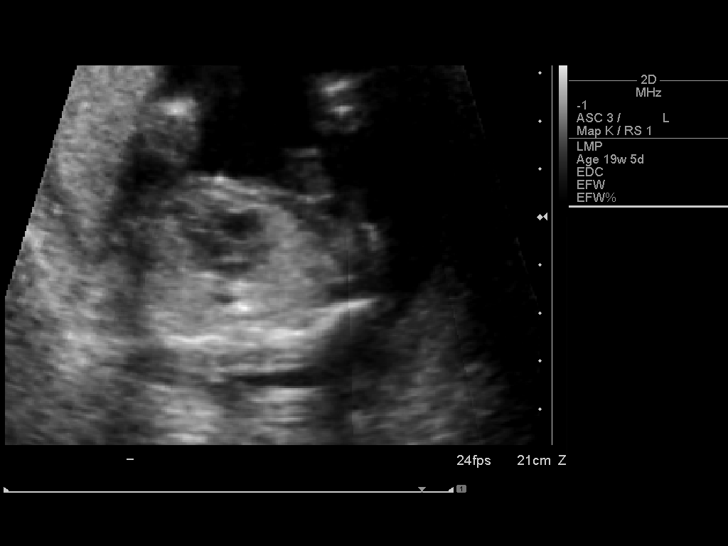
[im 39/46]
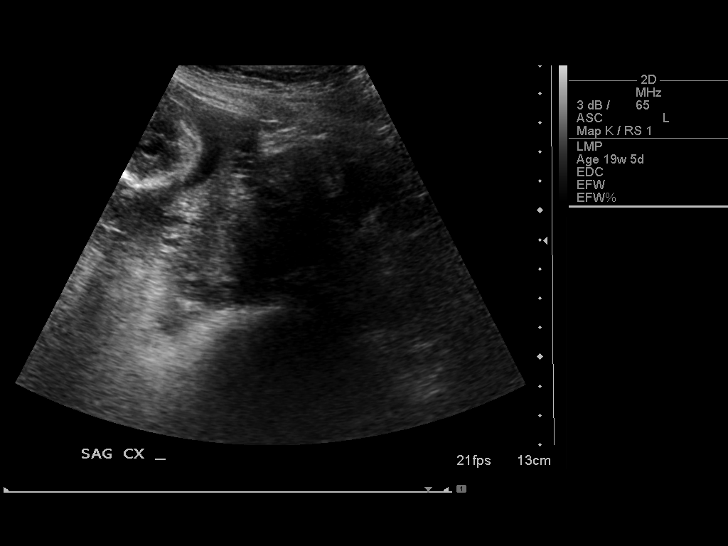
[im 42/46]
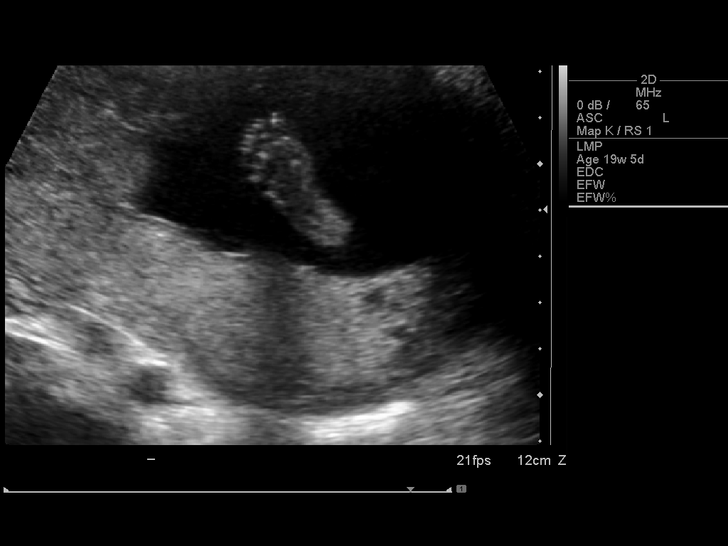
[im 46/46]
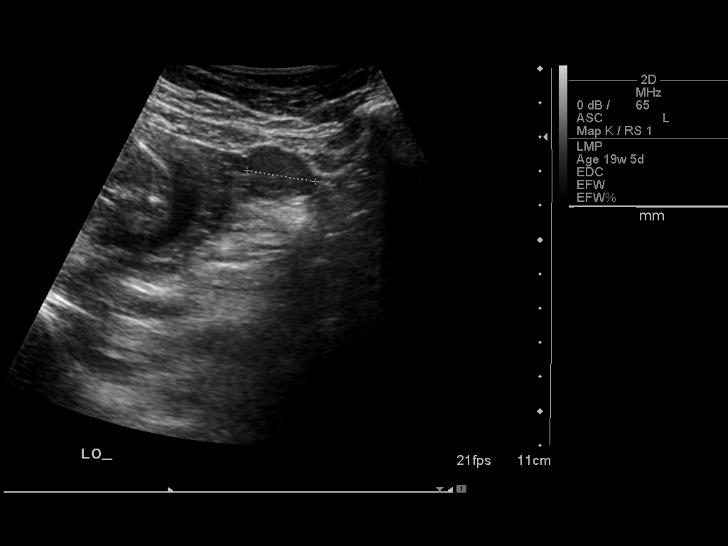

[14 of 28 positions shown; findings below may reference images not displayed]

IMPRESSION: See AS Obstetric US report.

## 2007-06-02 ENCOUNTER — Ambulatory Visit (HOSPITAL_COMMUNITY): Admission: RE | Admit: 2007-06-02 | Discharge: 2007-06-02 | Payer: Self-pay | Admitting: Family Medicine

## 2007-06-02 IMAGING — US US OB FOLLOW-UP
1 series · 14 of 28 positions shown · non-contrast
Comparison: none

OBSTETRICAL ULTRASOUND:
 This ultrasound exam was performed in the [HOSPITAL] Ultrasound Department.  The OB US report was generated in the AS system, and faxed to the ordering physician.  This report is also available in [REDACTED] PACS.

[Series 1: us ob follow-up · 0.30mm/px · 14 of 42 slices shown]
[im 2/42]
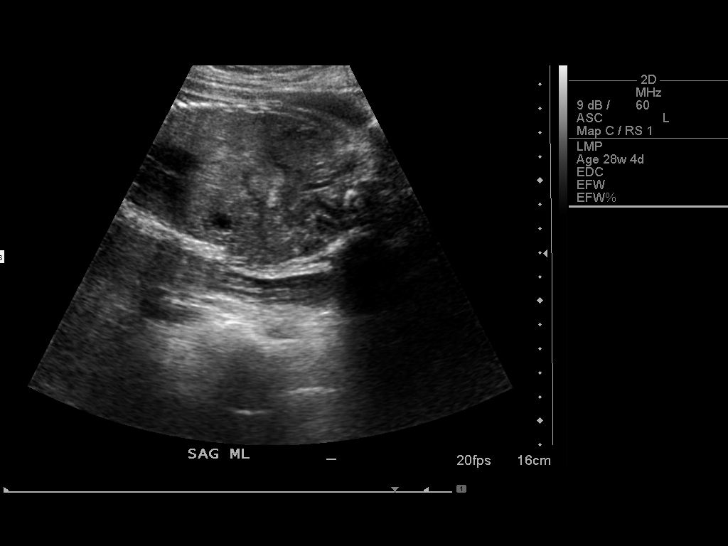
[im 5/42]
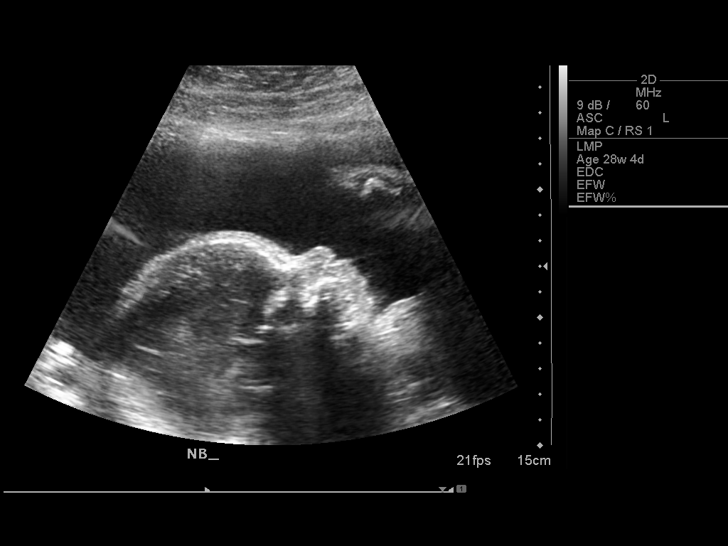
[im 8/42]
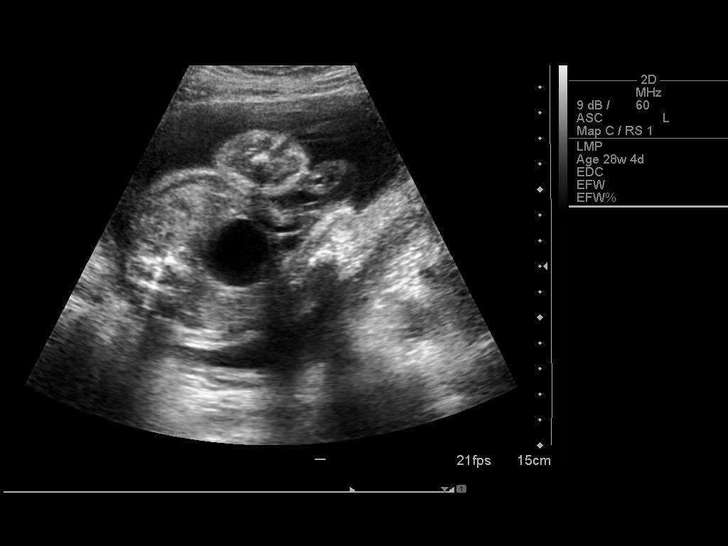
[im 11/42]
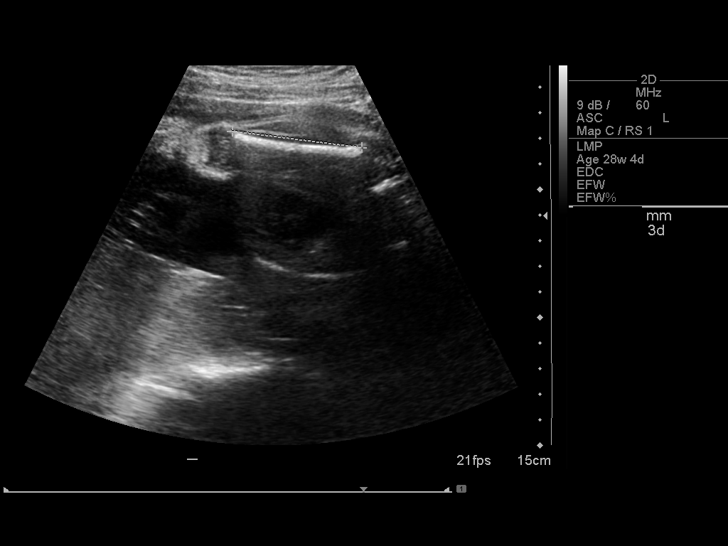
[im 14/42]
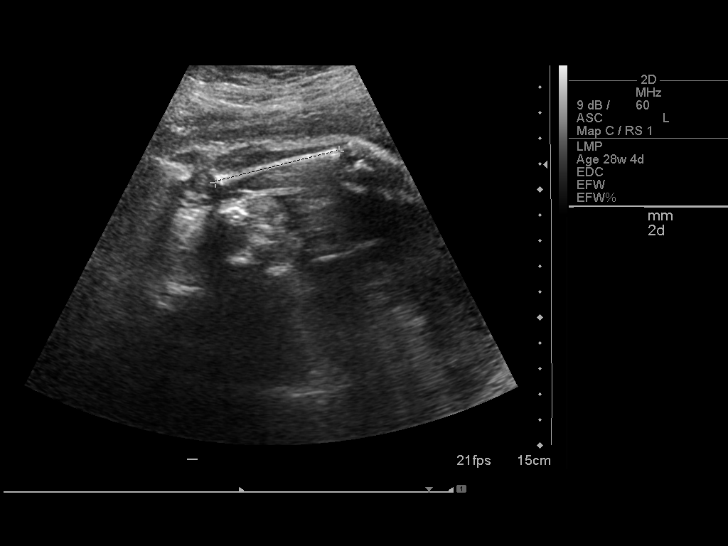
[im 17/42]
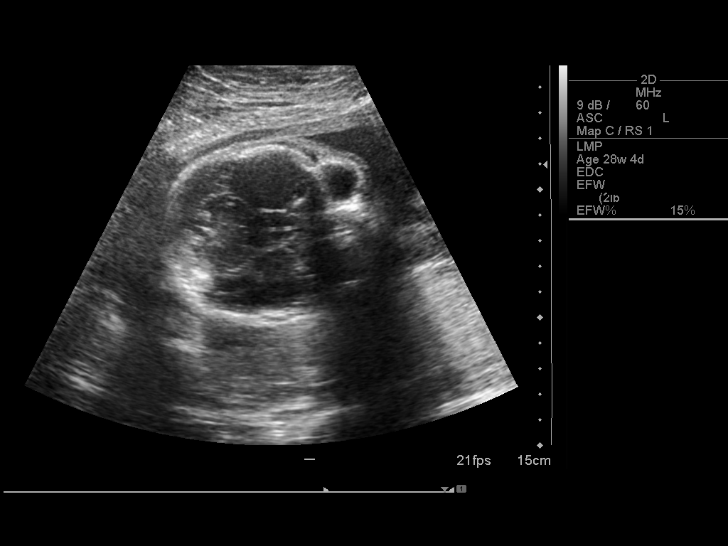
[im 20/42]
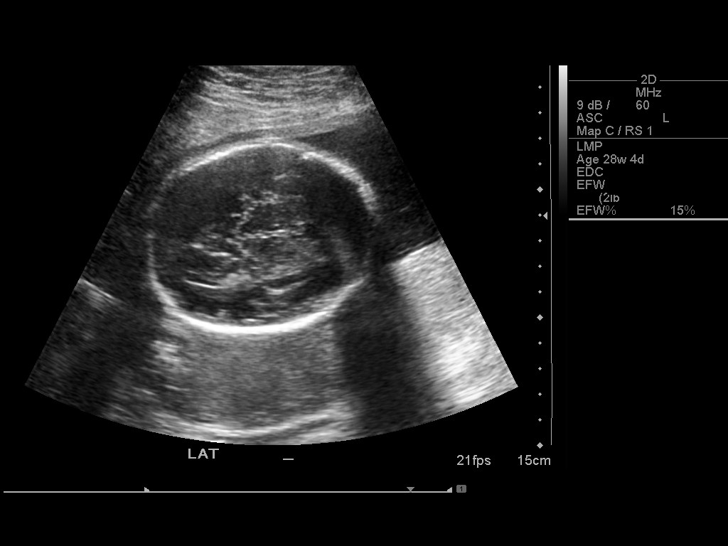
[im 23/42]
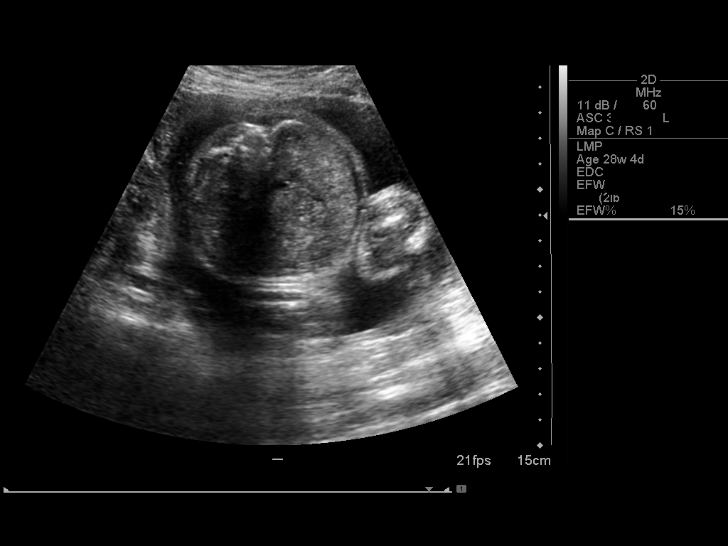
[im 26/42]
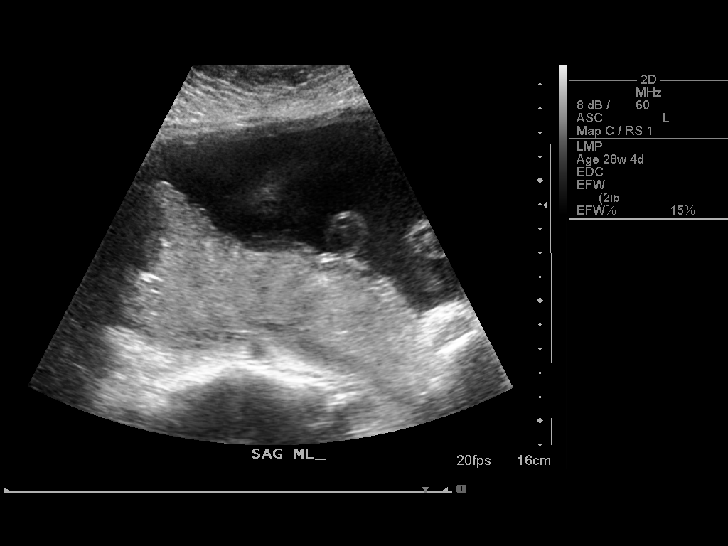
[im 29/42]
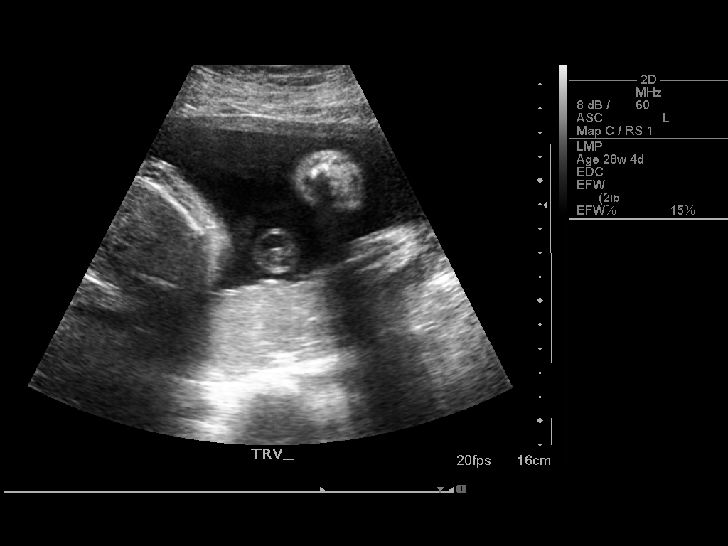
[im 32/42]
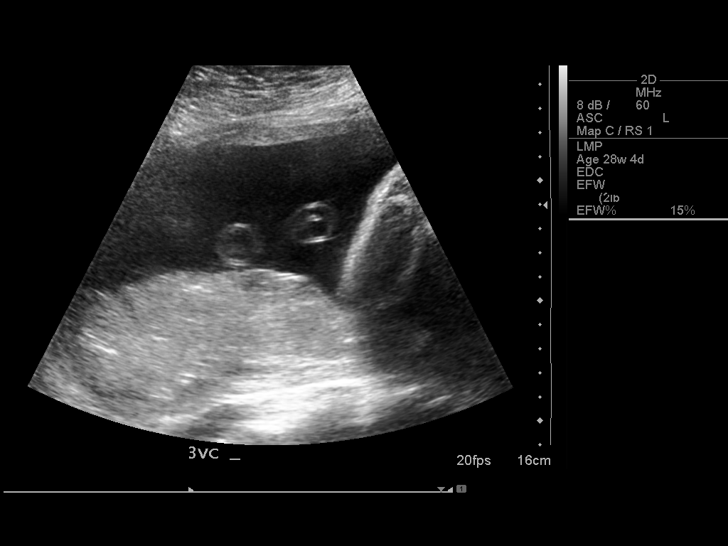
[im 35/42]
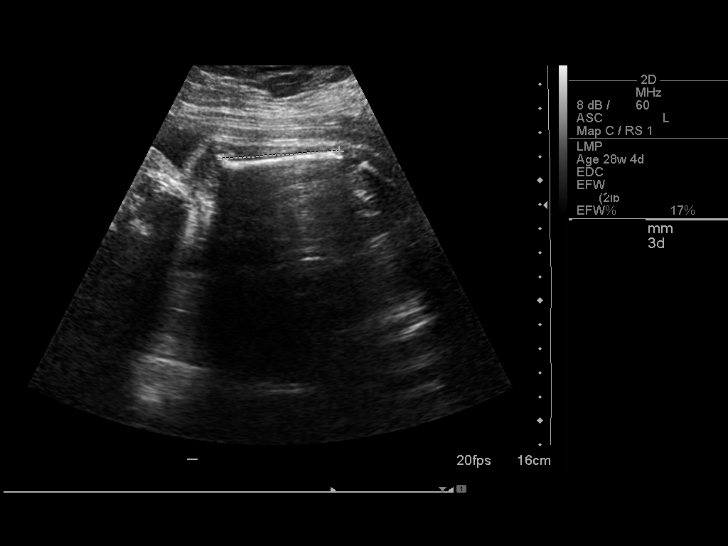
[im 38/42]
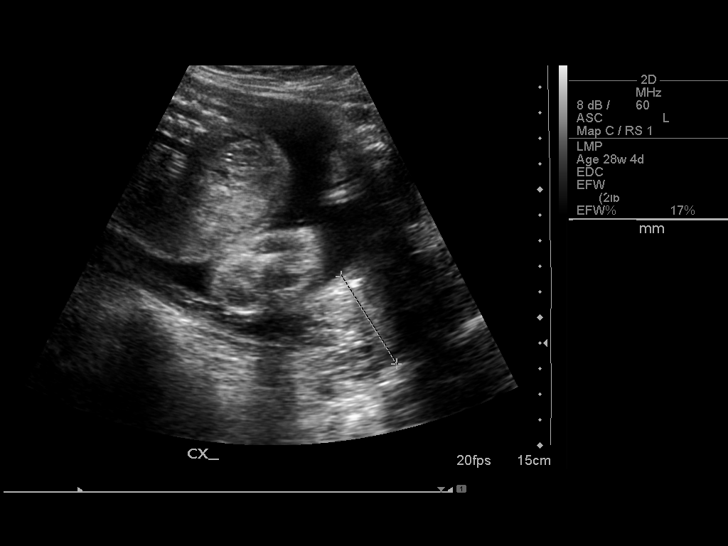
[im 42/42]
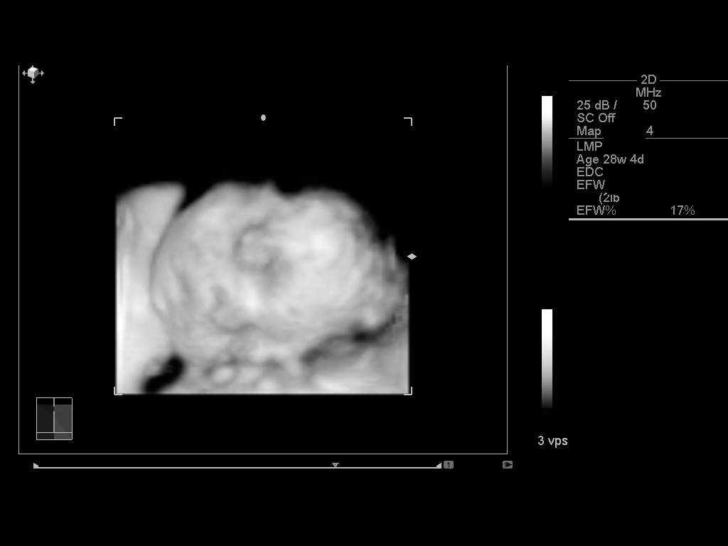

[14 of 28 positions shown; findings below may reference images not displayed]

IMPRESSION: See AS Obstetric US report.

## 2007-07-14 ENCOUNTER — Ambulatory Visit: Payer: Self-pay | Admitting: Obstetrics & Gynecology

## 2007-07-14 ENCOUNTER — Inpatient Hospital Stay (HOSPITAL_COMMUNITY): Admission: AD | Admit: 2007-07-14 | Discharge: 2007-07-14 | Payer: Self-pay | Admitting: Obstetrics and Gynecology

## 2007-07-14 IMAGING — US US OB FOLLOW-UP
1 series · 14 of 25 positions shown · non-contrast
Comparison: none

OBSTETRICAL ULTRASOUND:
 This ultrasound exam was performed in the [HOSPITAL] Ultrasound Department.  The OB US report was generated in the AS system, and faxed to the ordering physician.  This report is also available in [REDACTED] PACS.

[Series 1: us ob follow-up · 0.31mm/px · 14 of 25 slices shown]
[im 1/25]
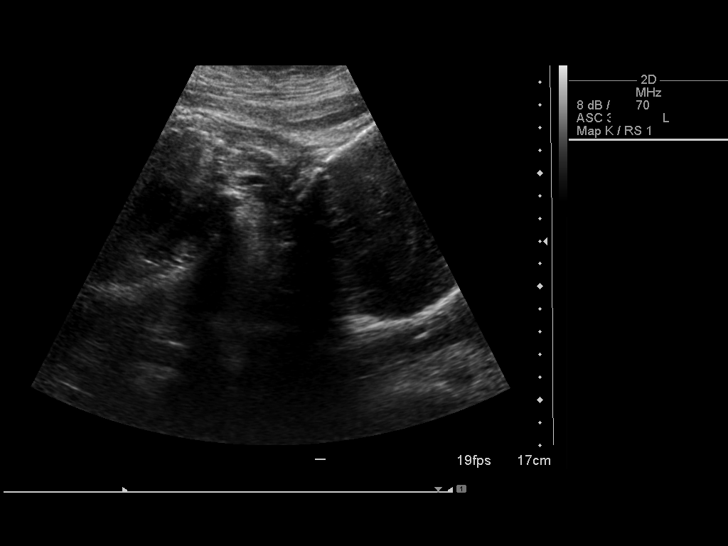
[im 3/25]
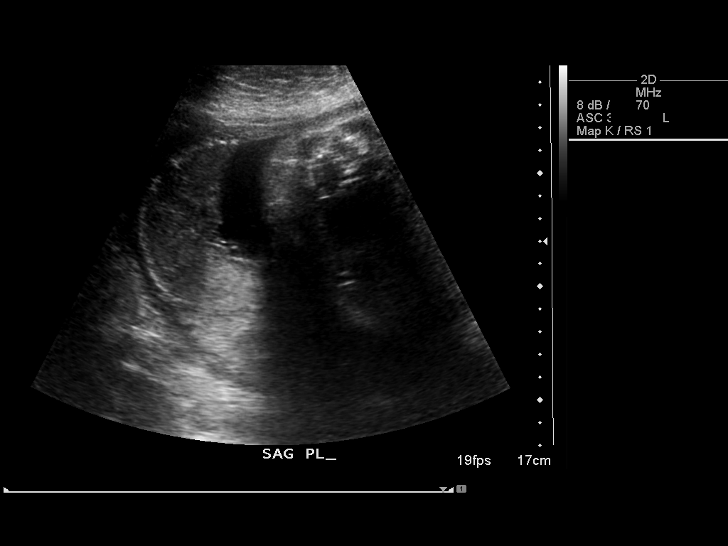
[im 5/25]
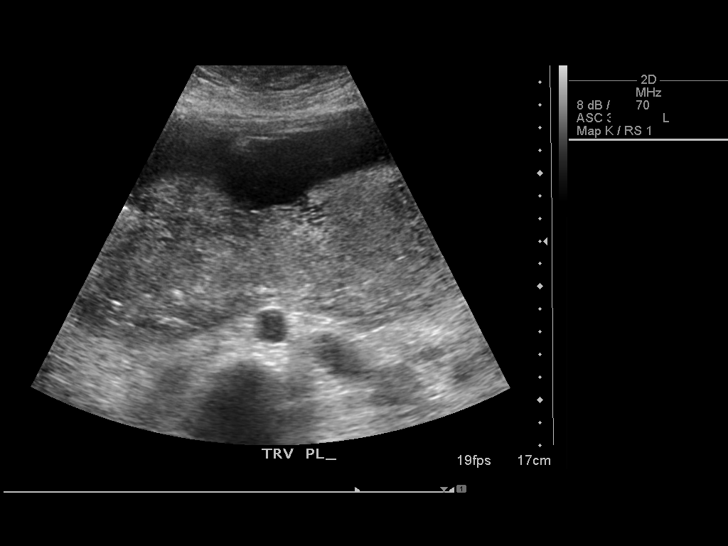
[im 7/25]
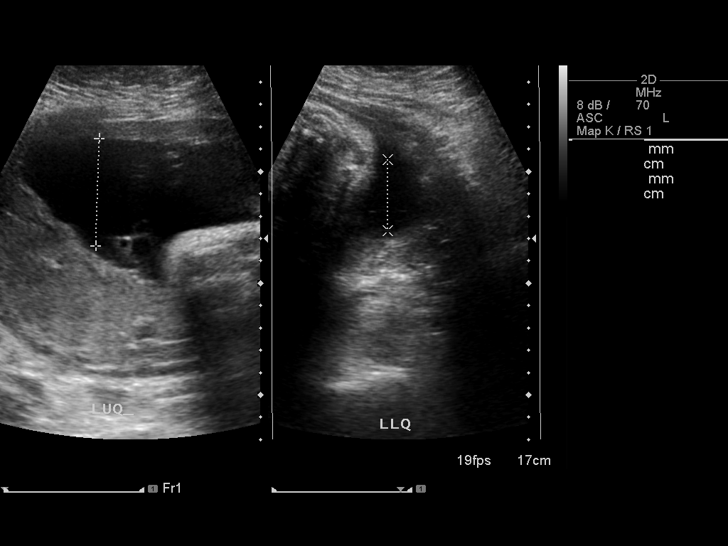
[im 9/25]
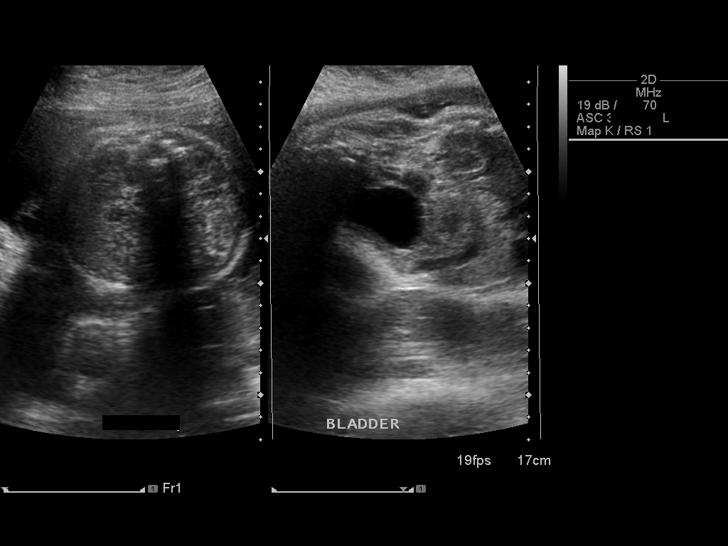
[im 10/25]
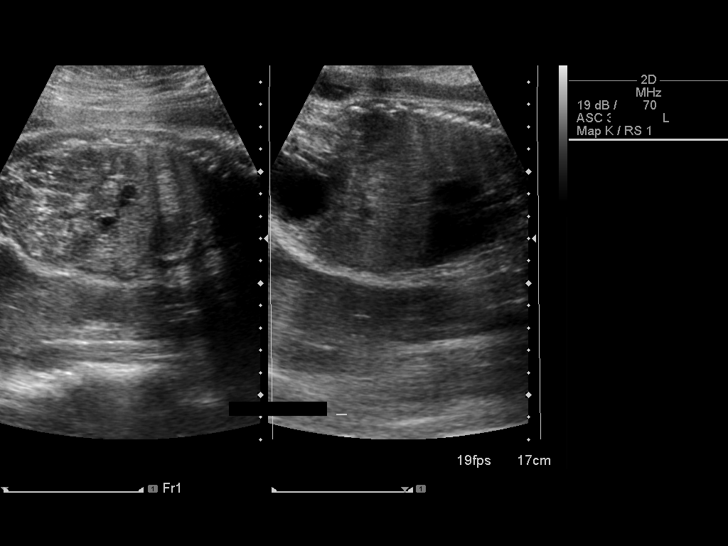
[im 12/25]
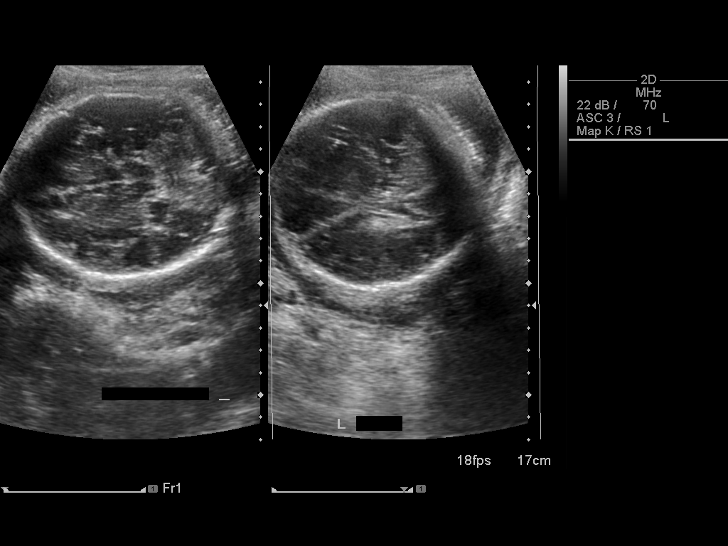
[im 14/25]
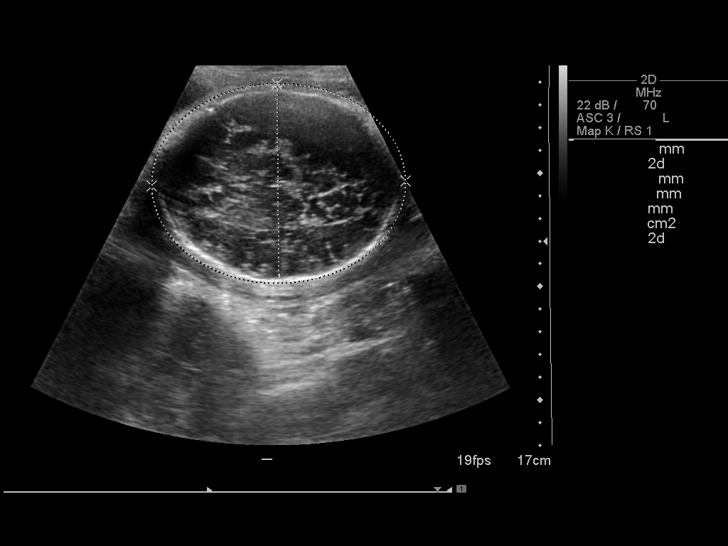
[im 16/25]
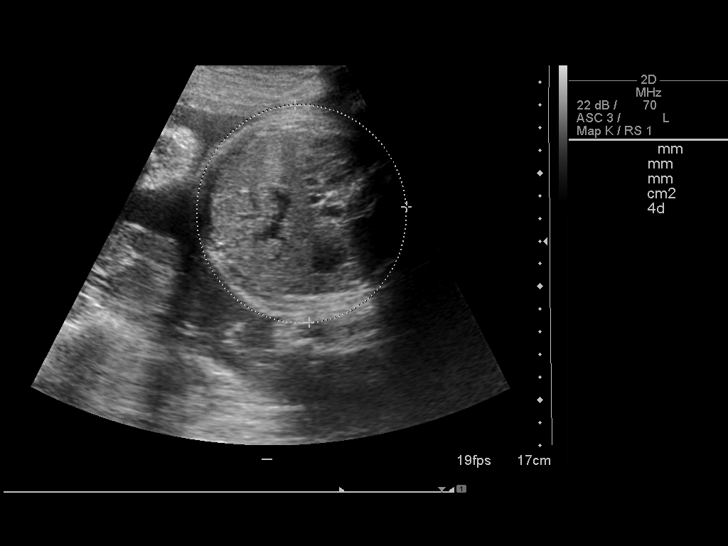
[im 17/25]
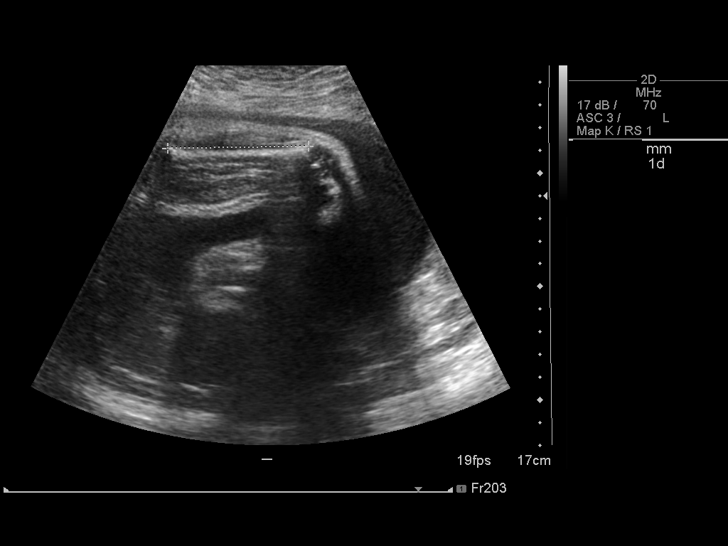
[im 19/25]
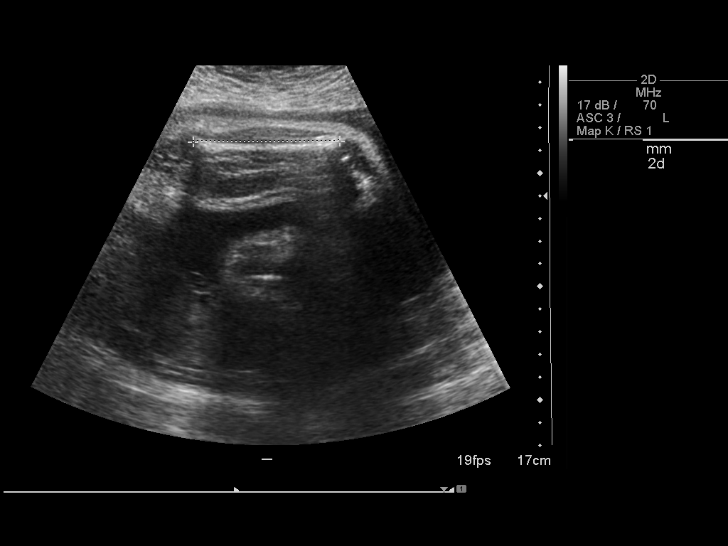
[im 21/25]
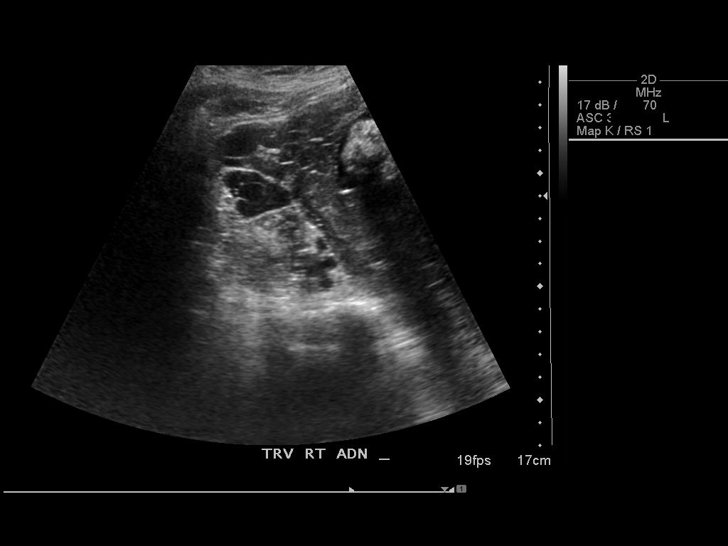
[im 23/25]
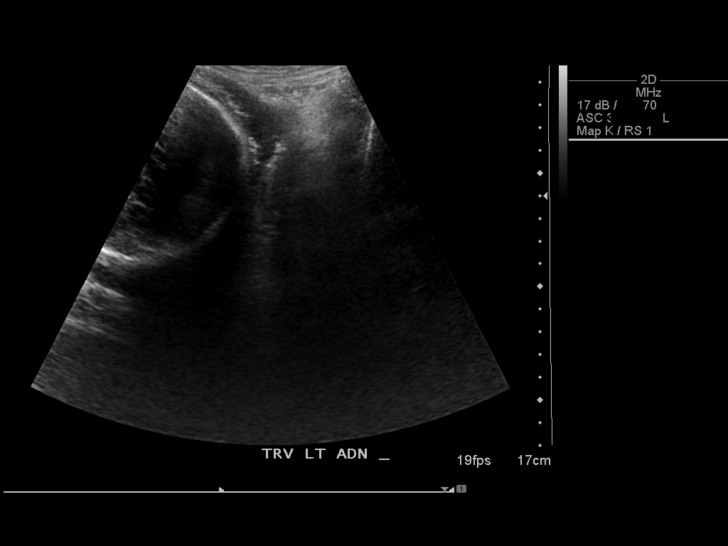
[im 25/25]
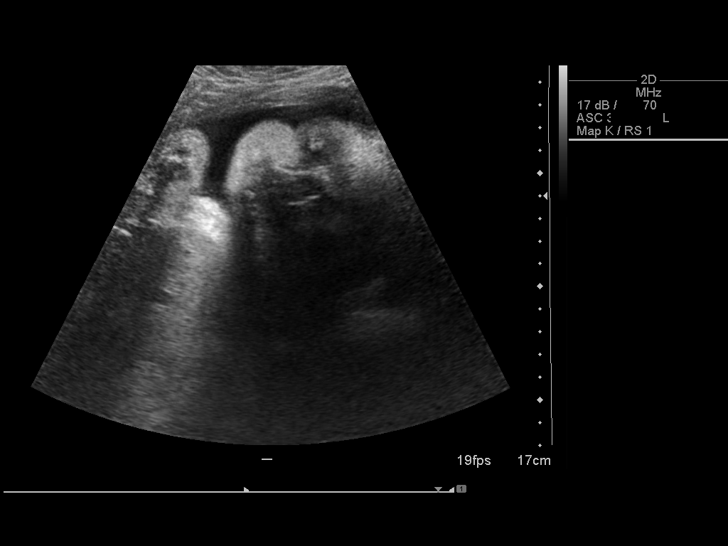

[14 of 25 positions shown; findings below may reference images not displayed]

IMPRESSION: See AS Obstetric US report.

## 2007-07-16 ENCOUNTER — Ambulatory Visit: Payer: Self-pay | Admitting: Family Medicine

## 2007-07-20 ENCOUNTER — Ambulatory Visit: Payer: Self-pay | Admitting: Gynecology

## 2007-07-30 ENCOUNTER — Ambulatory Visit: Payer: Self-pay | Admitting: *Deleted

## 2007-07-31 ENCOUNTER — Ambulatory Visit (HOSPITAL_COMMUNITY): Admission: RE | Admit: 2007-07-31 | Discharge: 2007-07-31 | Payer: Self-pay | Admitting: Obstetrics & Gynecology

## 2007-07-31 ENCOUNTER — Inpatient Hospital Stay (HOSPITAL_COMMUNITY): Admission: AD | Admit: 2007-07-31 | Discharge: 2007-08-04 | Payer: Self-pay | Admitting: Gynecology

## 2007-07-31 ENCOUNTER — Ambulatory Visit: Payer: Self-pay | Admitting: Gynecology

## 2007-07-31 IMAGING — US US OB FOLLOW-UP
1 series · 14 of 22 positions shown · non-contrast
Comparison: none

OBSTETRICAL ULTRASOUND:
 This ultrasound exam was performed in the [HOSPITAL] Ultrasound Department.  The OB US report was generated in the AS system, and faxed to the ordering physician.  This report is also available in [REDACTED] PACS.

[Series 1: us ob re-eval · 14 of 22 slices shown]
[im 1/22]
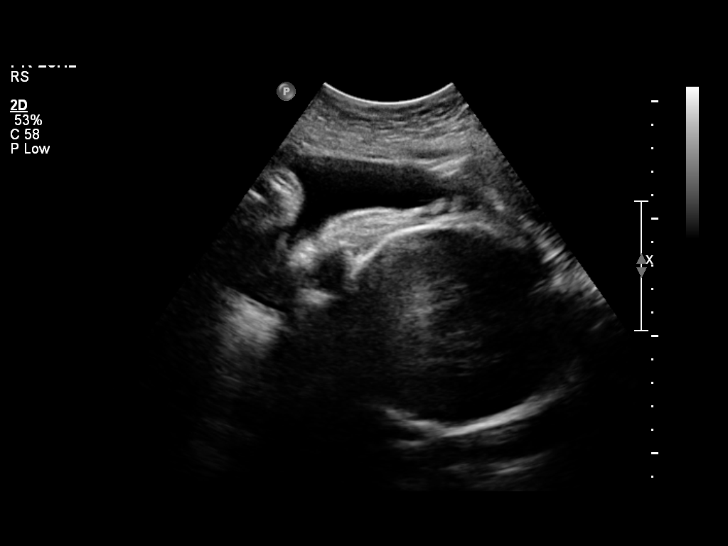
[im 3/22]
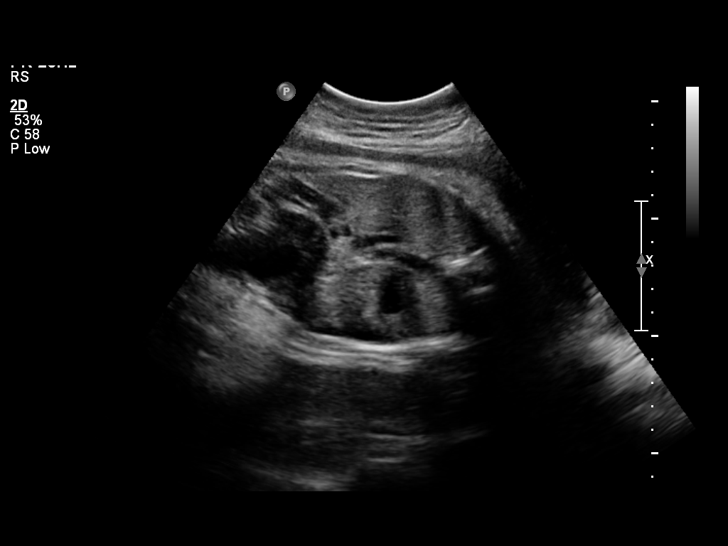
[im 4/22]
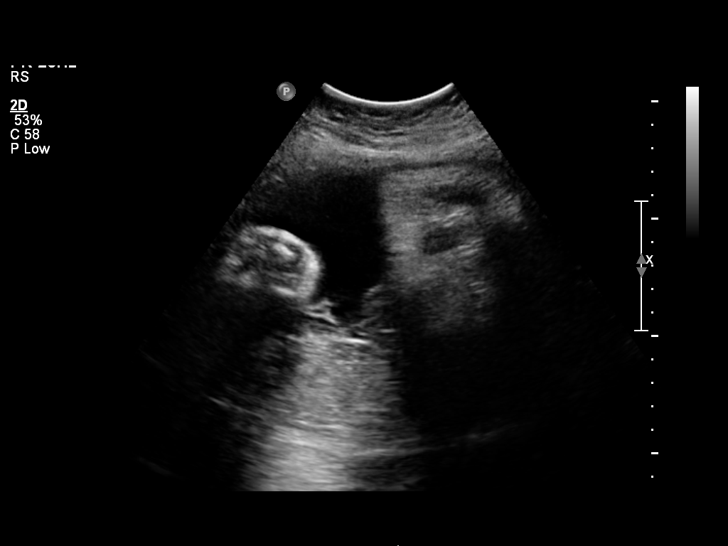
[im 6/22]
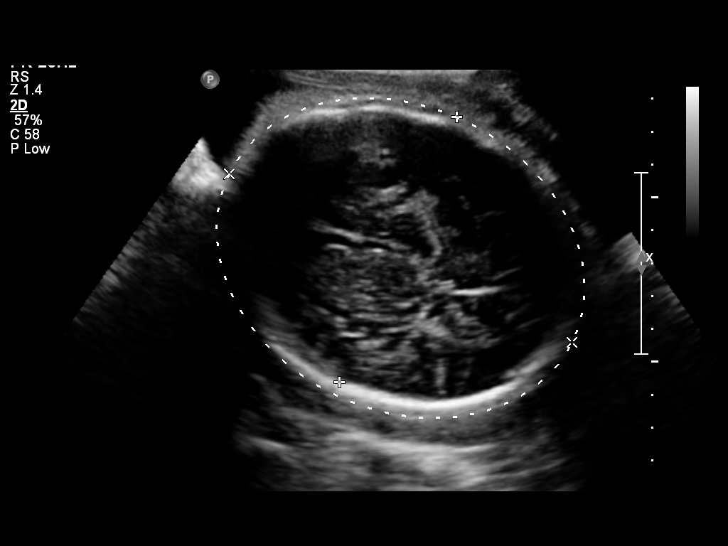
[im 8/22]
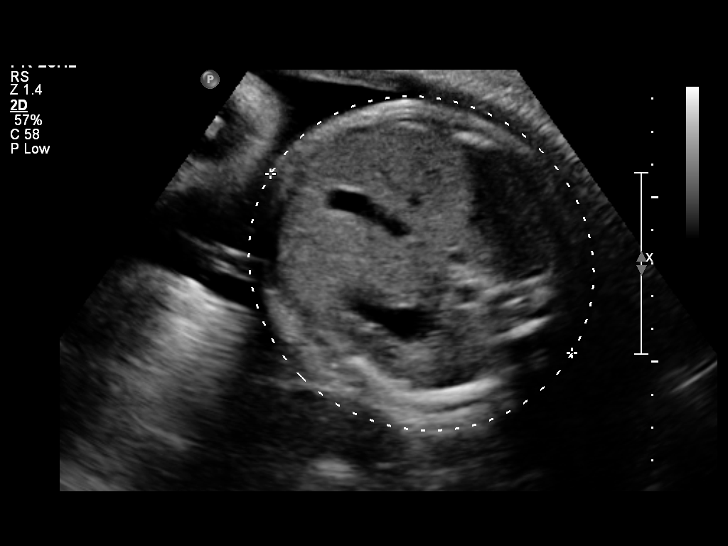
[im 9/22]
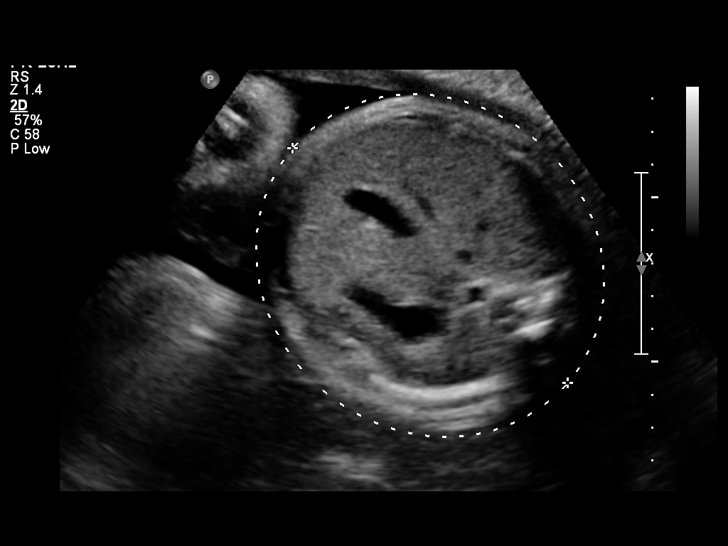
[im 11/22]
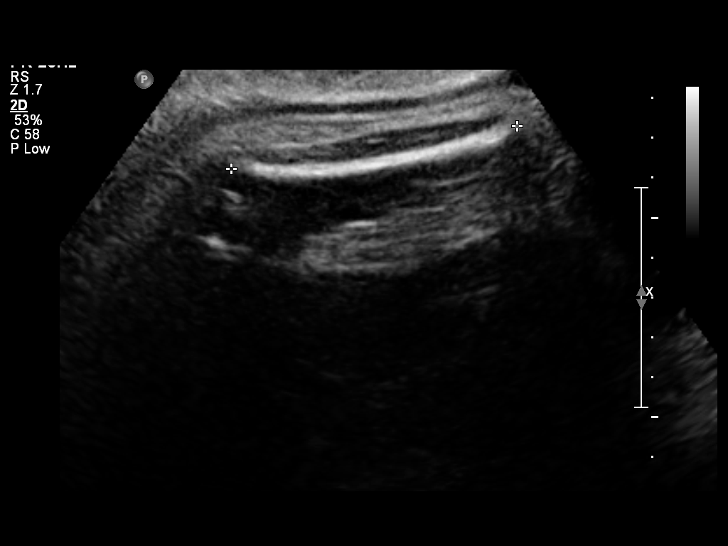
[im 12/22]
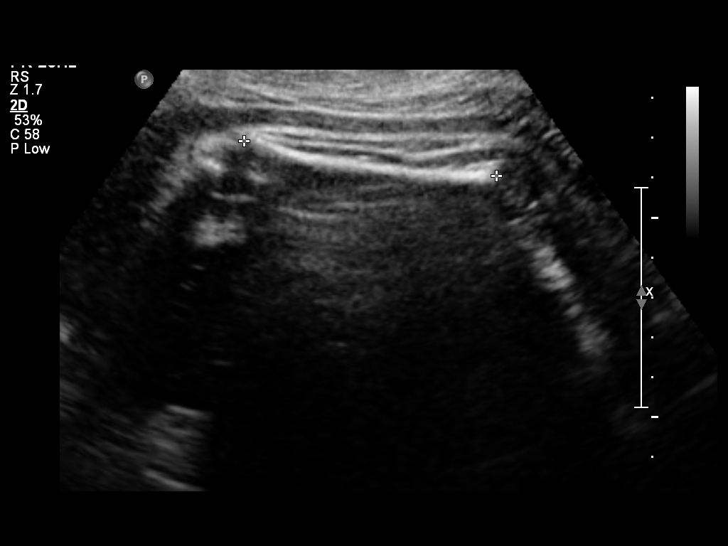
[im 14/22]
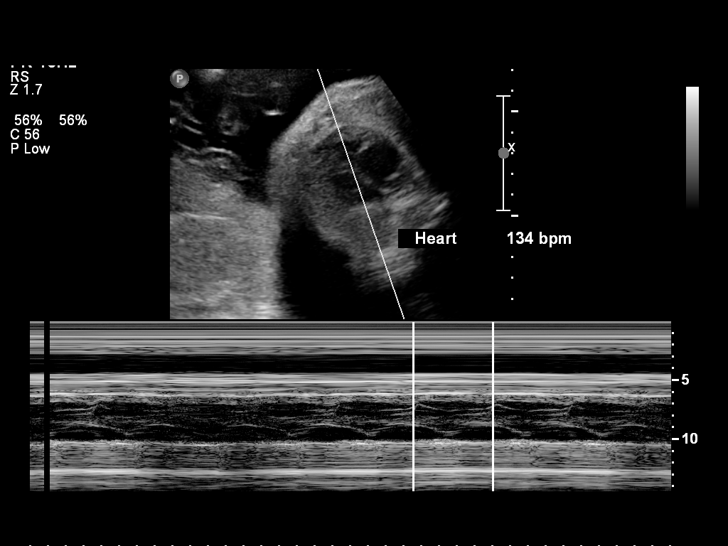
[im 15/22]
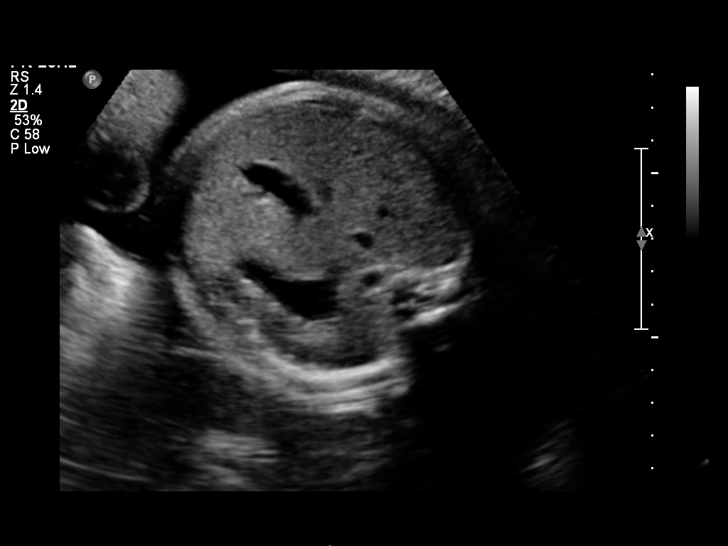
[im 17/22]
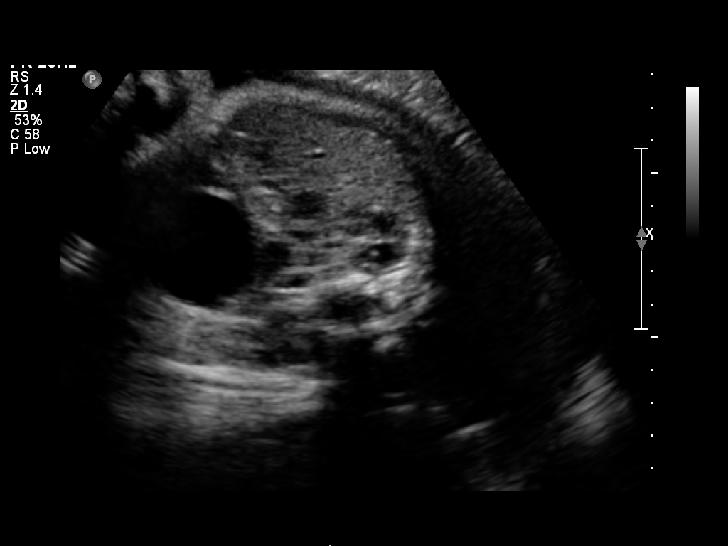
[im 19/22]
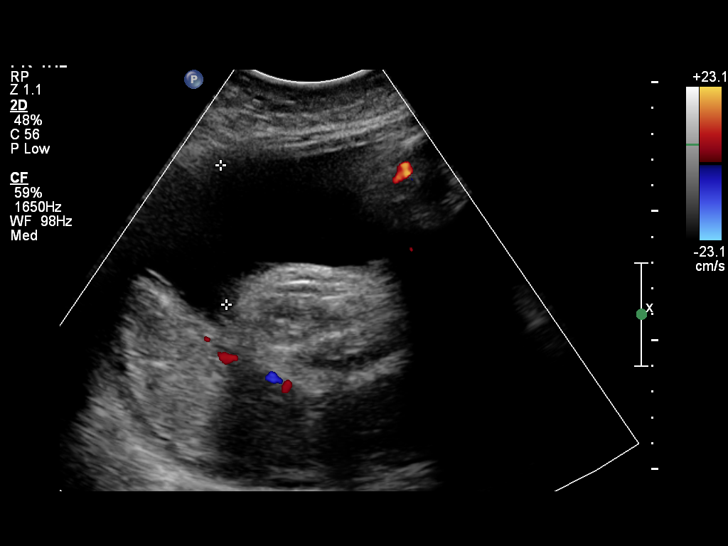
[im 20/22]
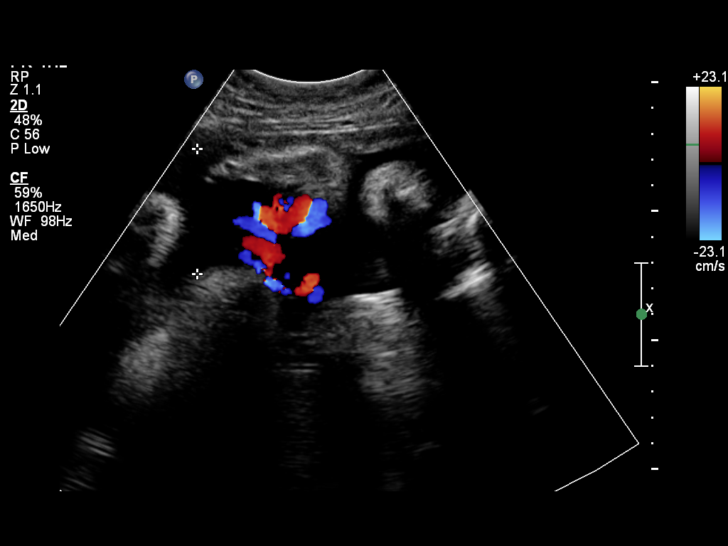
[im 22/22]
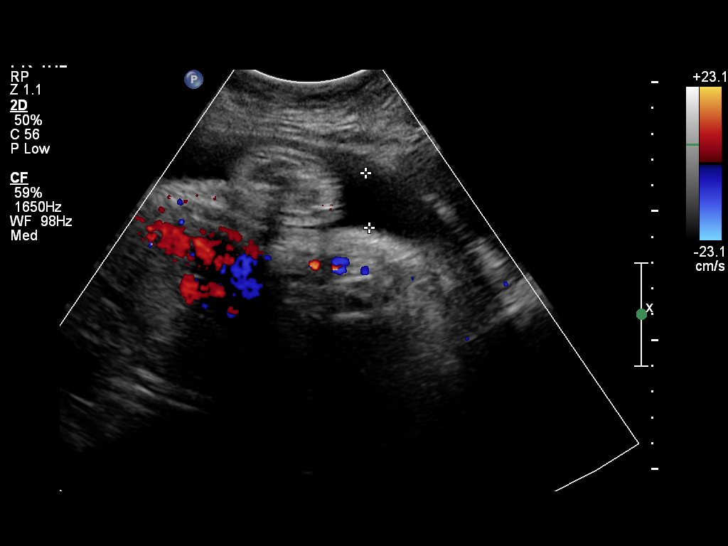

[14 of 22 positions shown; findings below may reference images not displayed]

IMPRESSION: See AS Obstetric US report.

## 2007-08-01 ENCOUNTER — Encounter (INDEPENDENT_AMBULATORY_CARE_PROVIDER_SITE_OTHER): Payer: Self-pay | Admitting: Gynecology

## 2010-05-22 NOTE — Discharge Summary (Signed)
[name], [name]               ACCOUNT NO.:  [id number]   MEDICAL RECORD NO.:  [id number]          PATIENT TYPE:  INP   LOCATION:  9143                          FACILITY:  [facility]   PHYSICIAN:  Ginger Carne, MD  DATE OF BIRTH:  1975-11-17   DATE OF ADMISSION:  07/31/2007  DATE OF DISCHARGE:  08/04/2007                               DISCHARGE SUMMARY   DISCHARGE DIAGNOSES:  1. Status post primary low transverse cesarean section with T      incision.  2. Bilateral tubal ligation.  3. Intrauterine pregnancy at 37.5 weeks' gestation.  4. Chronic hypertension.  5. Anemia   PROCEDURE:  Primary low transverse cesarean section with T incision.   HOSPITAL COURSE:  1. Intrauterine pregnancy at 46.5 weeks' gestational age.  The patient      is a 35 year old G5, P2-1-2-3, who presented at 37.5 weeks with      spontaneous rupture of membranes.  The patient was admitted to the      labor and delivery suite.  Upon exam, the baby was noted to be in      breech position.  This was confirmed with a bedside ultrasound,      therefore the patient was consented and deemed appropriate for      cesarean section.  The patient also wanted a bilateral tubal      ligation for sterilization during surgery.  Therefore, the patient      underwent a primary low transverse cesarean section with T incision      and bilateral tubal ligation.  The patient tolerated the procedure      well and delivered a viable female infant.  Baby cried      spontaneously at delivery.  Cord blood and cord pH were sent.      Placenta was complete 3-vessel cord with central insertion.  There      were no complications from the surgery.  Estimated blood loss was      700 mL.  After surgery, the patient did well.  The incision was      clean, dry, and intact on the day of discharge, and the patient      only complained of mild abdominal pain that was controlled well      with oral pain medication.  2. Chronic hypertension.   The patient claims that she has had elevated      blood pressures around 150s/100s for 7 years.  Upon admission, the      patient's blood pressure remained elevated throughout her stay, but      were stable.  The medications hydrochlorothiazide 25 mg p.o. daily      and Norvasc 10 mg p.o. daily were administered to the patient.      These also seemed to help stabilize the blood pressures.  Last      blood pressure prior to discharge was 123/82.  The patient denied      any headache, vision changes, right upper quadrant pain, or chest      pain while on the floor.  PIH labs were obtained  and were all      within normal limits.  3. Anemia.  The patient claims to have low blood counts at baseline.      The patient's estimated blood loss from surgery was around 700 mL.      The patient's hemoglobin after surgery was significantly decreased.      This was watched serially with repeat CBCs.  The hemoglobin      remained stable for 3 days.  The patient was asymptomatic from this      anemia.  The patient denied any lightheadedness, fatigue, chest      pain, or headache.   ADMISSION LABS:  Blood type O positive, antibody screen negative,  hemoglobin 10, platelets 384, rubella immune, hepatitis B surface  antigen negative, syphilis nonreactive, HIV nonreactive, GC negative,  Chlamydia negative, and GBS unknown.   DISCHARGE MEDICATIONS:  1. Ibuprofen 600 mg, take 1 by mouth every 6 hours, as needed for      pain.  2. Colace 100 mg, take 1 by mouth twice a day, as needed for      constipation.  3. Iron sulfate 325 mg, take 1 by mouth twice a day for anemia.  4. Norvasc 10 mg, take 1 tablet by mouth once a day for hypertension.  5. Hydrochlorothiazide 25 mg, take 1 tablet by mouth once daily for      hypertension.  6. Percocet 5/325 mg 1 tablet by mouth every 6 hours as needed for      pain.   DISCHARGE INSTRUCTIONS:  The patient is to have pelvic rest x6 weeks.   FOLLOW UP:  The patient is  to have baby love nurse remove the staples in  4-7 days.  Baby love nurse also to recheck blood pressure in 4-7 days.  The patient is to go to the [facility] Department in 6 weeks  for a routine postpartum check.  The patient is also encouraged to see  primary care Jamie [name] for evaluation and treatment for chronic  hypertension.      Jamie Sole, MD  Electronically Signed     ______________________________  Ginger Carne, MD    WS/MEDQ  D:  08/04/2007  T:  08/05/2007  Job:  [id number]

## 2010-05-22 NOTE — Op Note (Signed)
Jamie [name], ARON               ACCOUNT NO.:  [id number]   MEDICAL RECORD NO.:  [id number]          PATIENT TYPE:  INP   LOCATION:                                FACILITY:  [facility]   PHYSICIAN:  Ginger Carne, MD  DATE OF BIRTH:  Jan 05, 1976   DATE OF PROCEDURE:  08/01/2007  DATE OF DISCHARGE:                               OPERATIVE REPORT   PREOPERATIVE DIAGNOSES:  Breech presentation in labor, spontaneous  rupture of membranes, 37-5/7th weeks' gestational age, and request for  sterilization.   POSTOPERATIVE DIAGNOSES:  Transverse presentation of fetus at 37-5/7th  weeks' gestation, spontaneous rupture of membranes, sterilization, and  term viable delivery of female infant. Sterilization   PROCEDURE:  Primary low-transverse cesarean section with T-incision,  Pomeroy bilateral tubal ligation.   SURGEON:  Ginger Carne, MD   ASSISTANT:  None.   COMPLICATIONS:  None immediate.   ESTIMATED BLOOD LOSS:  700 mL.   ANESTHESIA:  Spinal.   COMPLICATIONS:  None immediate.   SPECIMENS:  Cord bloods and cord pH.   OPERATIVE FINDINGS:  Term viable female infant delivered in a transverse  right occiput presentation back down.  Amniotic fluid was non-foul  smelling and clear.  No gross abnormalities.  Baby cried spontaneously  at delivery.  The fetus was gently maneuvered from a right occiput  transverse presentation to a vertex presentation.  A T incision was  necessary following low-transverse cesarean section to facilitate  delivery due to the uterus contracting and placing pressure on the fetus  for manipulation.  Placenta was complete, three-vessel cord, central  insertion.  Uterus, tubes, and ovaries showed normal decidual changes of  pregnancy. Both tubes were identified from their isthmus to ampullary  junction separate and appart from their respective round ligaments.  Weight, apgars and cord pH per delivery room record.   OPERATIVE PROCEDURE:  The patient was  prepped and draped in the usual  fashion and placed in the left lateral supine position.  Betadine  solution used for antiseptic, and the patient was catheterized prior to  the procedure.  After adequate spinal analgesia, a Pfannenstiel incision  was made and the abdomen opened.  The lower uterine segment incised  transversely.  A T-incision had to be made to facilitate delivery.  Fetus delivered, cord clamped and cut, and infant given to the pediatric  staff after bulb suctioning.  Placenta removed manually.  Uterus  inspected.  Closure of the uterine musculature with 0-Vicryl running  interlocking suture 2 layers from the vertical aspect of the incision  and 1 layer from the transverse aspect.  Following this, both tubes were  tied in a standard modified Pomeroy tubal ligation method.  Either tube  was grasped at the ampullary isthmus junction, and approximately 3 cm of  tube were incorporated in a double loop of 2-0 plain catgut, tubes tied  with above said knot, and tips cauterized.  No active bleeding noted.  Following this, blood clots removed from the abdomen.  No active  bleeding noted.  Closure of the fascia in 1 layer with double-looped 0-  PDS running suture, and skin staples for the skin.  Instruments and  sponge count were correct.  The patient tolerated the procedure well and  returned to the postanesthesia recovery room in excellent condition.  Weight, cord pH, and Apgars per delivery room record.      Ginger Carne, MD  Electronically Signed     SHB/MEDQ  D:  08/01/2007  T:  08/01/2007  Job:  [id number]

## 2010-05-25 NOTE — Discharge Summary (Signed)
Jamie [name], Jamie [name]               ACCOUNT NO.:  [id number]   MEDICAL RECORD NO.:  [id number]          PATIENT TYPE:  INP   LOCATION:  9153                          FACILITY:  [facility]   PHYSICIAN:  Lesly Dukes, M.D. DATE OF BIRTH:  23-Sep-1975   DATE OF ADMISSION:  10/05/2003  DATE OF DISCHARGE:                                 DISCHARGE SUMMARY   DISCHARGE DIAGNOSES:  1.  Intrauterine pregnancy at 67 and six-sevenths weeks gestation.  2.  Preterm labor/cervical change.  3.  Elevated blood pressure.  4.  Group B streptococcus positive.  5.  Vaginal Trichomonas.   HISTORY OF PRESENTING ILLNESS:  This is a 35 year old gravida 3 para 0-1-1-1  who was sent over from high risk clinic because of uterine contractions with  cervical change.  External os is 2 cm dilated per Dr. Gavin Potters at the clinic.  She was also noted to have some mildly elevated blood pressures at high risk  clinic.   HOSPITAL COURSE:  #1 - PRETERM LABOR.  She was admitted to antenatal and  placed on continuous monitoring.  She did not have any uterine contractions  during her hospital observation.  Cervical exam performed prior to discharge  did not show any cervical change.   #2 - ELEVATED BLOOD PRESSURE.  A 24-hour urine was collected during her  hospital stay and is pending at the time of this dictation.  Her blood  pressures have remained good here in the hospital, with systolic blood  pressures in the 110s and diastolics in the 60s to 70s.   #3 - STREPTOCOCUS GROUP B VAGINAL INFECTION.  She was placed on IV Unasyn  upon her admission and will be continued on oral penicillin therapy for a  full 7-day course of treatment following discharge, with follow-up cultures  in the clinic.   #4 - VAGINAL TRICHOMONAS.  Wet prep was done upon admission for infection  because of her preterm labor and trichomonads were seen.  She was started on  p.o. Flagyl and will continue this as an outpatient for a full course of  therapy.  She was instructed regarding the methods of transmission and risk  for reacquiring Trichomonas.   #5 - Note that her urine drug screen was positive for marijuana.   LABORATORIES AND STUDIES:  Wet prep results as noted.  Her 24-hour urine is  pending.   DISCHARGE INSTRUCTIONS:  1.  She is discharged home.  2.  She is instructed on signs and symptoms of preterm labor.  3.  She is to be on bedrest with bathroom shower privileges and limited      activity.  4.  She is to avoid sexual intercourse until time of delivery.  5.  She is to follow up at the high risk clinic on Wednesday and call for an      appointment if she does not already have one.  6.  She is to call or return for any signs and symptoms of preterm labor,      changes in fetal activity, or other symptoms concerning to her.   DISCHARGE  MEDICATIONS:  1.  Penicillin V 500 mg p.o. t.i.d. with meals for 7 days.  2.  Flagyl 500 mg p.o. b.i.d. for 6 days.      MB/MEDQ  D:  10/06/2003  T:  10/06/2003  Job:  [id number]

## 2010-09-28 LAB — RAPID URINE DRUG SCREEN, HOSP PERFORMED
Barbiturates: NOT DETECTED
Benzodiazepines: NOT DETECTED
Opiates: NOT DETECTED

## 2010-09-28 LAB — URINE CULTURE

## 2010-09-28 LAB — TSH: TSH: 0.396

## 2010-09-28 LAB — DIFFERENTIAL
Eosinophils Absolute: 0.1
Lymphs Abs: 2.1
Monocytes Relative: 6

## 2010-09-28 LAB — CBC
HCT: 29.2 — ABNORMAL LOW
Hemoglobin: 9.6 — ABNORMAL LOW
MCHC: 32.8
Platelets: 236
RBC: 3.85 — ABNORMAL LOW
WBC: 10.3

## 2010-09-28 LAB — URINALYSIS, ROUTINE W REFLEX MICROSCOPIC: pH: 6

## 2010-09-28 LAB — URINE MICROSCOPIC-ADD ON

## 2010-09-28 LAB — ETHANOL: Alcohol, Ethyl (B): <5

## 2010-10-04 LAB — POCT URINALYSIS DIP (DEVICE)
Nitrite: NEGATIVE
Specific Gravity, Urine: 1.01
Urobilinogen, UA: 1
pH: 6

## 2010-10-04 LAB — COMPREHENSIVE METABOLIC PANEL
BUN: <1 — ABNORMAL LOW
CO2: 24
Calcium: 8.5
Chloride: 108
GFR calc Af Amer: >60
Glucose, Bld: 83
Total Bilirubin: 0.5

## 2010-10-04 LAB — URINALYSIS, ROUTINE W REFLEX MICROSCOPIC
Glucose, UA: NEGATIVE
Protein, ur: 30 — AB

## 2010-10-04 LAB — URINE MICROSCOPIC-ADD ON

## 2010-10-04 LAB — GC/CHLAMYDIA PROBE AMP, GENITAL: GC Probe Amp, Genital: NEGATIVE

## 2010-10-04 LAB — CBC
Hemoglobin: 9.4 — ABNORMAL LOW
MCV: 77.8 — ABNORMAL LOW
Platelets: 277
WBC: 7.6

## 2010-10-04 LAB — WET PREP, GENITAL: Trich, Wet Prep: NONE SEEN

## 2010-10-05 LAB — URINALYSIS, DIPSTICK ONLY
Bilirubin Urine: NEGATIVE
Glucose, UA: NEGATIVE
Ketones, ur: 15 — AB
Protein, ur: NEGATIVE
Urobilinogen, UA: 0.2

## 2010-10-05 LAB — HEPATIC FUNCTION PANEL
ALT: 13
AST: 17
Albumin: 1.8 — ABNORMAL LOW
Total Bilirubin: 0.4

## 2010-10-05 LAB — CBC
HCT: 20.9 — ABNORMAL LOW
HCT: 21.6 — ABNORMAL LOW
Hemoglobin: 6.6 — CL
Hemoglobin: 9.3 — ABNORMAL LOW
MCHC: 31.5
MCHC: 31.9
MCV: 77.3 — ABNORMAL LOW
MCV: 78.3
Platelets: 211
Platelets: 236
RBC: 3.75 — ABNORMAL LOW
RDW: 15.9 — ABNORMAL HIGH
RDW: 16 — ABNORMAL HIGH
RDW: 16.1 — ABNORMAL HIGH
RDW: 16.2 — ABNORMAL HIGH
WBC: 10.4
WBC: 10.9 — ABNORMAL HIGH
WBC: 11.1 — ABNORMAL HIGH

## 2010-10-05 LAB — RPR: RPR Ser Ql: NONREACTIVE

## 2010-10-05 LAB — RAPID URINE DRUG SCREEN, HOSP PERFORMED
Barbiturates: NOT DETECTED
Cocaine: NOT DETECTED
Opiates: NOT DETECTED

## 2010-10-05 LAB — COMPREHENSIVE METABOLIC PANEL
AST: 22
BUN: 2 — ABNORMAL LOW
CO2: 22
Calcium: 8.5
Chloride: 106
Creatinine, Ser: 0.46
GFR calc Af Amer: >60
GFR calc non Af Amer: >60
Total Bilirubin: 0.4

## 2010-10-05 LAB — POCT URINALYSIS DIP (DEVICE)
Nitrite: NEGATIVE
Specific Gravity, Urine: 1.015
Urobilinogen, UA: 1
pH: 7

## 2010-10-05 LAB — URIC ACID: Uric Acid, Serum: 4.2

## 2010-10-12 LAB — WET PREP, GENITAL
Clue Cells Wet Prep HPF POC: NONE SEEN
Yeast Wet Prep HPF POC: NONE SEEN

## 2010-10-12 LAB — CBC
MCHC: 32.6
MCV: 75.6 — ABNORMAL LOW
Platelets: 300
WBC: 10.8 — ABNORMAL HIGH

## 2010-10-12 LAB — GC/CHLAMYDIA PROBE AMP, GENITAL: GC Probe Amp, Genital: NEGATIVE

## 2010-10-12 LAB — POCT PREGNANCY, URINE: Operator id: 220991

## 2010-10-12 LAB — ABO/RH: ABO/RH(D): O POS

## 2010-12-22 ENCOUNTER — Emergency Department (HOSPITAL_COMMUNITY): Payer: Self-pay

## 2010-12-22 ENCOUNTER — Emergency Department (HOSPITAL_COMMUNITY)
Admission: EM | Admit: 2010-12-22 | Discharge: 2010-12-22 | Disposition: A | Discharge status: Home / Self Care | Payer: Self-pay | Attending: Emergency Medicine | Admitting: Emergency Medicine

## 2010-12-22 ENCOUNTER — Encounter: Payer: Self-pay | Admitting: Emergency Medicine

## 2010-12-22 DIAGNOSIS — R1310 Dysphagia, unspecified: Secondary | ICD-10-CM | POA: Insufficient documentation

## 2010-12-22 DIAGNOSIS — R220 Localized swelling, mass and lump, head: Secondary | ICD-10-CM | POA: Insufficient documentation

## 2010-12-22 DIAGNOSIS — K1370 Unspecified lesions of oral mucosa: Secondary | ICD-10-CM | POA: Insufficient documentation

## 2010-12-22 DIAGNOSIS — R110 Nausea: Secondary | ICD-10-CM | POA: Insufficient documentation

## 2010-12-22 DIAGNOSIS — F17200 Nicotine dependence, unspecified, uncomplicated: Secondary | ICD-10-CM | POA: Insufficient documentation

## 2010-12-22 DIAGNOSIS — R6889 Other general symptoms and signs: Secondary | ICD-10-CM | POA: Insufficient documentation

## 2010-12-22 DIAGNOSIS — K1379 Other lesions of oral mucosa: Secondary | ICD-10-CM

## 2010-12-22 DIAGNOSIS — R221 Localized swelling, mass and lump, neck: Secondary | ICD-10-CM | POA: Insufficient documentation

## 2010-12-22 DIAGNOSIS — R070 Pain in throat: Secondary | ICD-10-CM | POA: Insufficient documentation

## 2010-12-22 LAB — BASIC METABOLIC PANEL
GFR calc Af Amer: >90 mL/min (ref >90)
GFR calc non Af Amer: >90 mL/min (ref >90)
Potassium: 3.6 mEq/L (ref 3.5–5.1)
Sodium: 138 mEq/L (ref 135–145)

## 2010-12-22 LAB — CBC
MCHC: 33.1 g/dL (ref 30.0–36.0)
RDW: 15.3 % (ref 11.5–15.5)

## 2010-12-22 LAB — RAPID STREP SCREEN (MED CTR MEBANE ONLY): Streptococcus, Group A Screen (Direct): NEGATIVE

## 2010-12-22 IMAGING — CR DG NECK SOFT TISSUE
2 series · 2 of 2 positions shown · non-contrast
Comparison: None.

CLINICAL DATA: Difficulty swallowing

NECK SOFT TISSUES - 1+ VIEW

[w soft tissue neck]
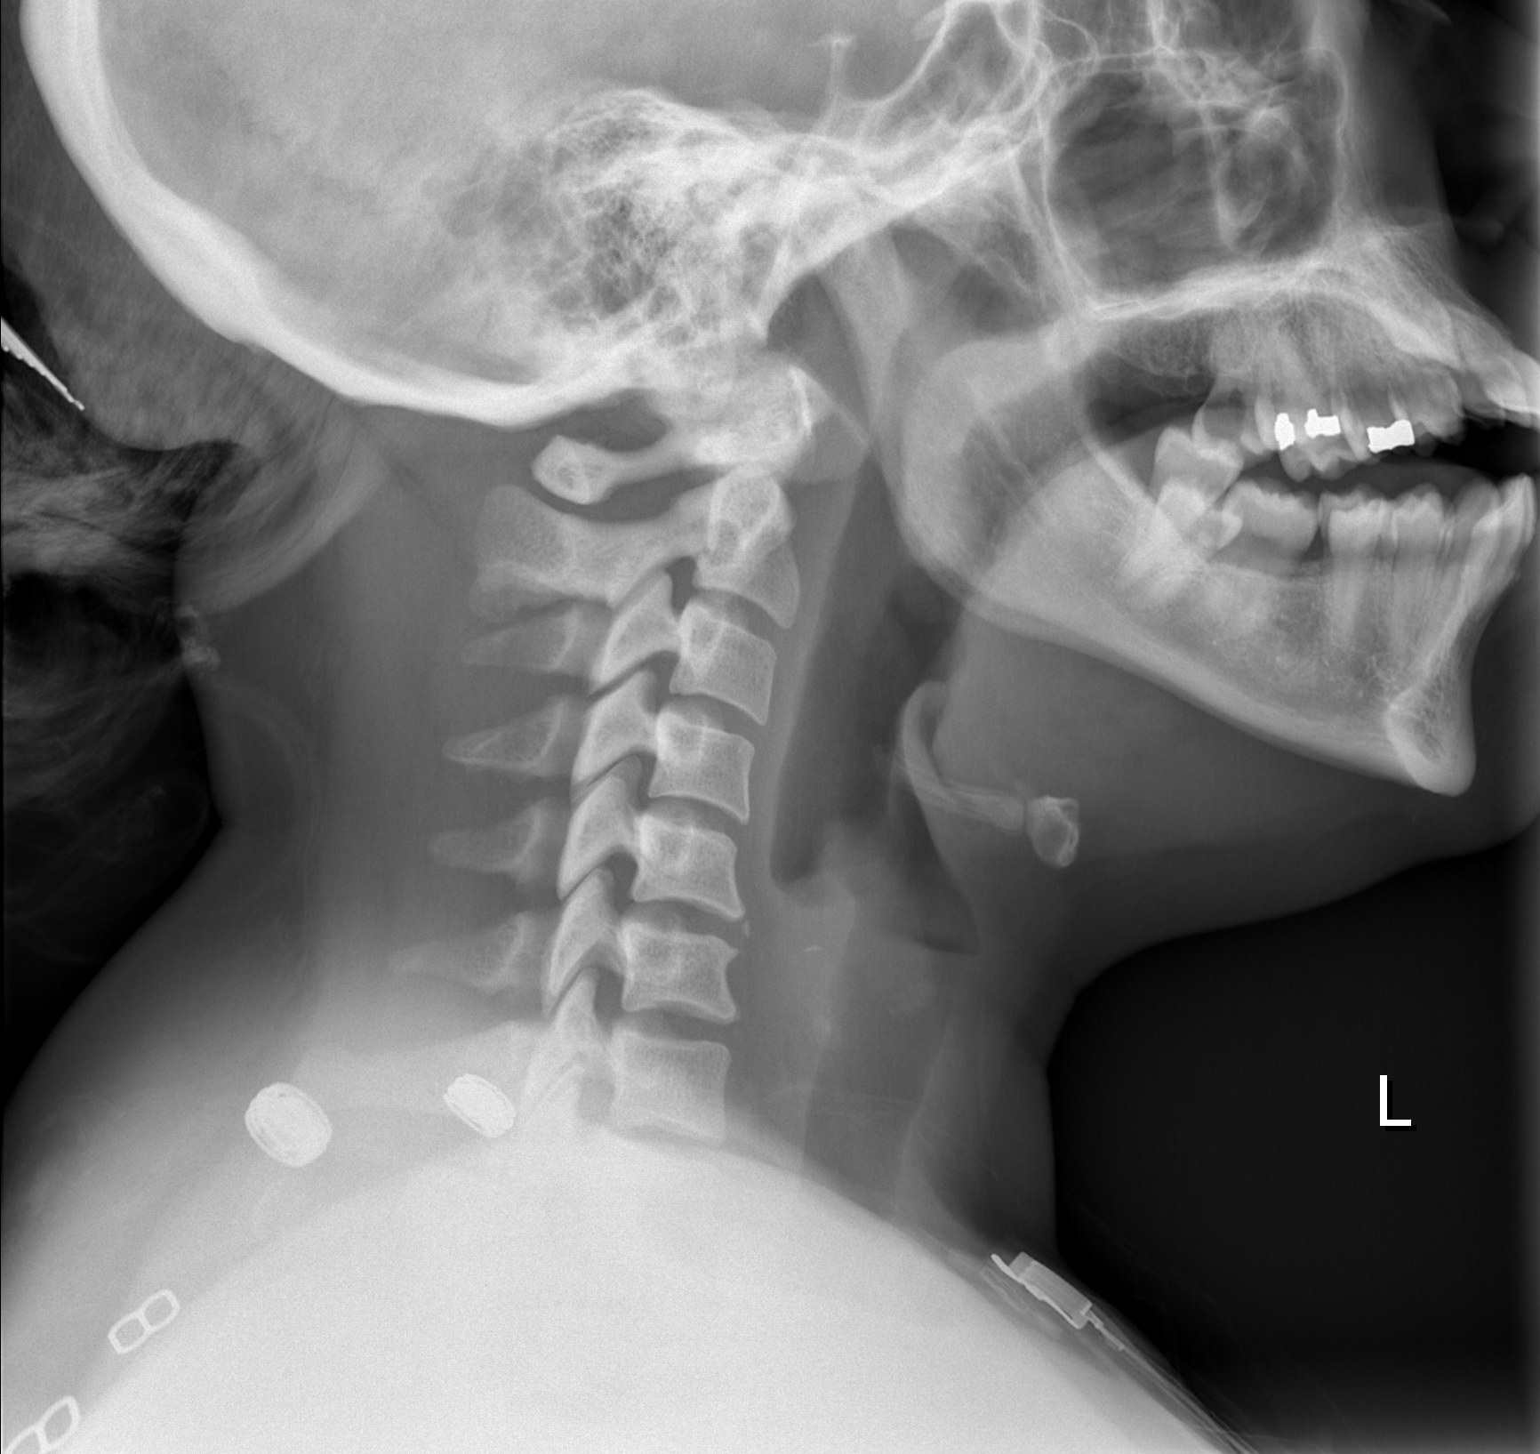

[w soft tissue neck ap]
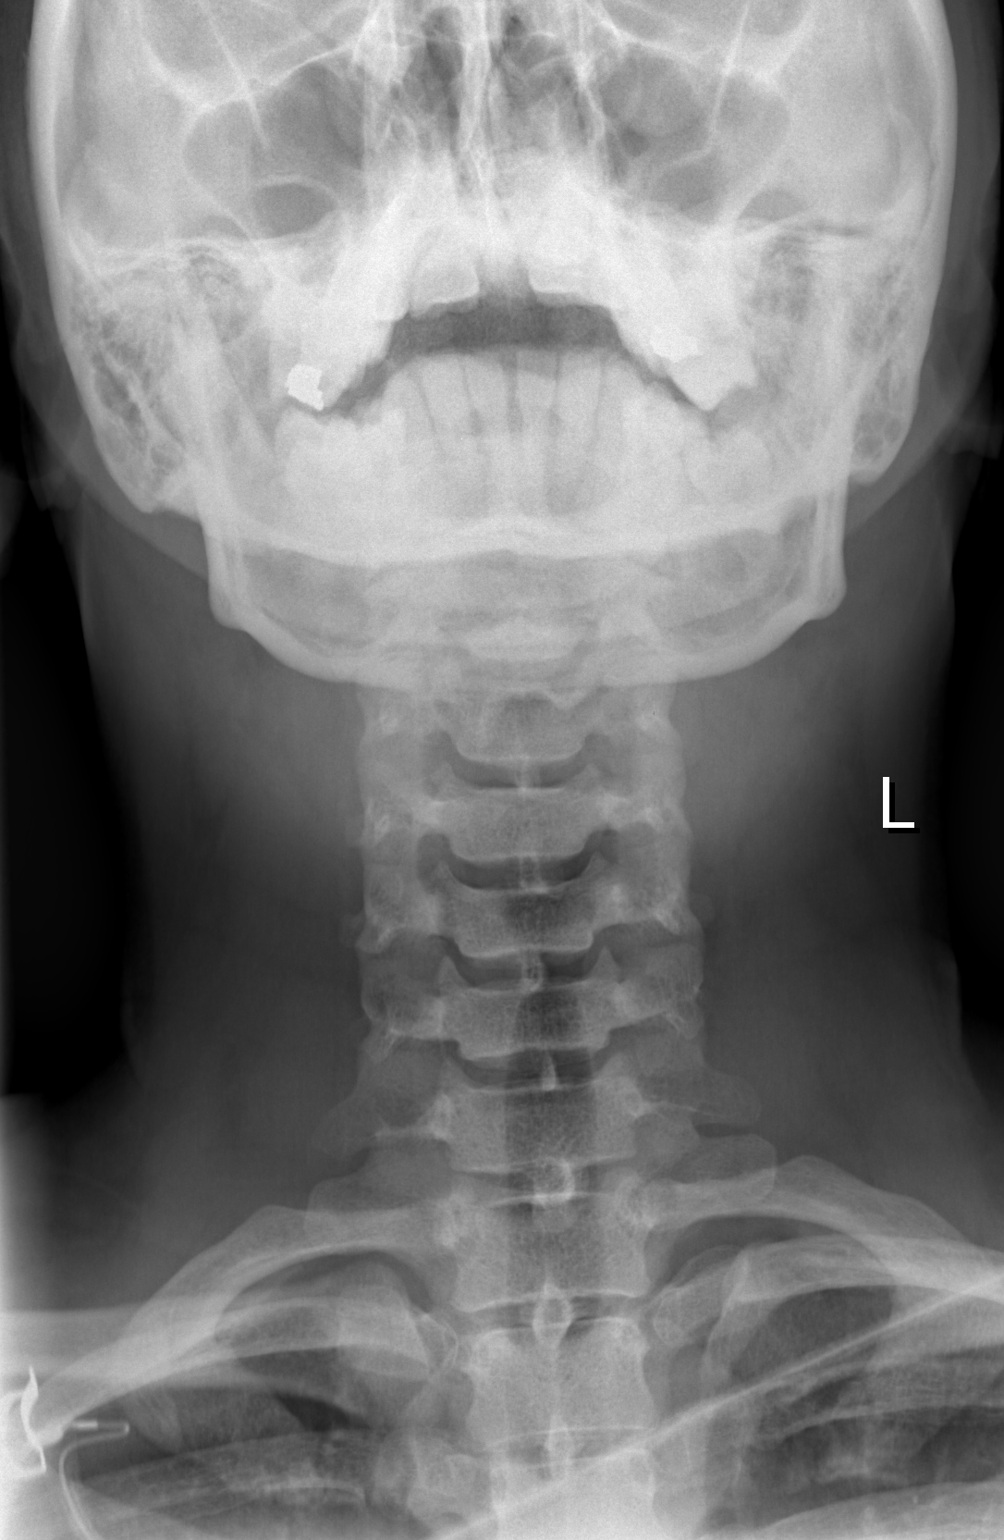

[2 of 2 positions shown; findings below may reference images not displayed]

FINDINGS: No prevertebral soft tissue swelling.  The epiglottis is
within normal limits. Patent airway.  The visualized osseous
structures are intact.  No radiopaque foreign body identified.  No
subcutaneous gas.  No aggressive osseous lesion.
IMPRESSION: Soft tissue radiograph of the neck is within normal limits.

## 2010-12-22 MED ORDER — RACEPINEPHRINE HCL 2.25 % IN NEBU
0.5000 mL | INHALATION_SOLUTION | Freq: Once | RESPIRATORY_TRACT | Status: AC
  Administered 2010-12-22: 0.5 mL via RESPIRATORY_TRACT
  Filled 2010-12-22: qty 0.5

## 2010-12-22 MED ORDER — PREDNISONE (PAK) 10 MG PO TABS: 10.0000 mg | ORAL_TABLET | Freq: Every day | ORAL | Status: AC

## 2010-12-22 MED ORDER — ONDANSETRON HCL 4 MG/2ML IJ SOLN
4.0000 mg | Freq: Once | INTRAMUSCULAR | Status: AC
  Administered 2010-12-22: 4 mg via INTRAVENOUS
  Filled 2010-12-22: qty 2

## 2010-12-22 MED ORDER — DEXAMETHASONE SODIUM PHOSPHATE 10 MG/ML IJ SOLN
10.0000 mg | Freq: Once | INTRAMUSCULAR | Status: AC
  Administered 2010-12-22: 10 mg via INTRAVENOUS
  Filled 2010-12-22: qty 1

## 2010-12-22 MED ORDER — ONDANSETRON 4 MG PO TBDP: 4.0000 mg | ORAL_TABLET | Freq: Once | ORAL | Status: DC

## 2010-12-22 MED ORDER — IBUPROFEN 800 MG PO TABS: 800.0000 mg | ORAL_TABLET | Freq: Three times a day (TID) | ORAL | Status: AC

## 2010-12-22 NOTE — ED Provider Notes (Signed)
Medical screening examination/treatment/procedure(s) were conducted as a shared visit with non-physician practitioner(s) and myself.  I personally evaluated the patient during the encounter  Edema and erythema of soft palate and uvula.   Hanley Seamen, MD 12/22/10 [phone]

## 2010-12-22 NOTE — ED Provider Notes (Signed)
9:47 AM Patient is in CDU holding for continued monitoring of uvular swelling.  Patient states she is feeling much better this morning, has been able to drink water and eat jello, is now hungry and requesting solid food.  Tachycardia has resolved. On exam, pt is A&Ox4, NAD, speaking in full sentences without difficulty, pharynx with mild edema of uvula and left tonsil.    10:30 AM Patient has eating crackers without difficulty, states she continues to feel the swelling in the back of her throat but denies any pain or difficulty swallowing or breathing.  Pt states she is ready to go home.  Does not have PCP - will give resources.   On exam, pt is A&Ox4, NAD, speaking clearly in full sentences without difficulty, mild edema of posterior pharynx as before, no worsening, possible slight improvement, RRR, CTAB, no stridor.  Filed Vitals:   12/22/10 1000  BP: 134/88  Pulse: 93  Temp:   Resp: 21   Plan is for d/c home with prednisone.      Rise Patience, [place] 12/22/10 [phone]

## 2010-12-22 NOTE — ED Notes (Signed)
Patient with enlarged uvula, having hard time swallowing and feeling like she is being gagged.  Patient states she has been vomiting with this feeling.  No drooling at this time.

## 2010-12-22 NOTE — ED Notes (Signed)
Pt given graham crackers and is able to eat without any difficulty.

## 2010-12-22 NOTE — ED Provider Notes (Signed)
History     CSN: [id number] Arrival date & time: 12/22/2010  2:35 AM   First MD Initiated Contact with Patient 12/22/10 0410      Chief Complaint  Patient presents with  . Sore Throat    (Consider location/radiation/quality/duration/timing/severity/associated sxs/prior treatment) The history is provided by the patient.    Pt presents to ED for swelling of the Uvula. She was drinking this afternoon, vomited and developed pain and swelling in her throat. Pt is able to breath but feels uncomfortable like something large is in her throat. She denies recent cold, diarrhea, CP, neck soreness, or loss of good airway.   History reviewed. No pertinent past medical history.  History reviewed. No pertinent past surgical history.  No family history on file.  History  Substance Use Topics  . Smoking status: Current Everyday Smoker  . Smokeless tobacco: Not on file  . Alcohol Use: Yes    OB History    Grav Para Term Preterm Abortions TAB SAB Ect Mult Living                  Review of Systems  Constitutional: Negative for chills, diaphoresis and appetite change.  HENT: Negative for ear pain, facial swelling, neck pain, neck stiffness and ear discharge.   Respiratory: Negative for cough, choking, chest tightness and shortness of breath.   Cardiovascular: Negative for chest pain, palpitations and leg swelling.  Musculoskeletal: Negative for joint swelling.  All other systems reviewed and are negative.    Allergies  Review of patient's allergies indicates no known allergies.  Home Medications  No current outpatient prescriptions on file.  BP 141/98  Pulse 74  Temp(Src) 97.9 F (36.6 C) (Oral)  Resp 17  SpO2 97%  Physical Exam  Constitutional: She appears well-developed and well-nourished.  HENT:  Head: Normocephalic and atraumatic.  Mouth/Throat:    Eyes: Pupils are equal, round, and reactive to light.  Neck: Trachea normal, normal range of motion and full passive  range of motion without pain. Neck supple.  Cardiovascular: Normal rate, regular rhythm and normal pulses.   Pulmonary/Chest: Effort normal and breath sounds normal. Chest wall is not dull to percussion. She exhibits no tenderness, no crepitus, no edema, no deformity and no retraction.  Abdominal: Normal appearance.  Musculoskeletal: Normal range of motion.  Neurological: She is alert. She has normal strength.  Skin: Skin is warm, dry and intact.  Psychiatric: She has a normal mood and affect. Her speech is normal and behavior is normal. Judgment and thought content normal. Cognition and memory are normal.    ED Course  Procedures (including critical care time)  Labs Reviewed  CBC - Abnormal; Notable for the following:    Hemoglobin 11.3 (*)    HCT 34.1 (*)    MCV 72.6 (*)    MCH 24.0 (*)    All other components within normal limits  BASIC METABOLIC PANEL - Abnormal; Notable for the following:    Glucose, Bld 117 (*)    BUN 5 (*)    All other components within normal limits  RAPID STREP SCREEN   Dg Neck Soft Tissue  12/22/2010  *RADIOLOGY REPORT*  Clinical Data: Difficulty swallowing  NECK SOFT TISSUES - 1+ VIEW  Comparison: None.  Findings: No prevertebral soft tissue swelling.  The epiglottis is within normal limits. Patent airway.  The visualized osseous structures are intact.  No radiopaque foreign body identified.  No subcutaneous gas.  No aggressive osseous lesion.  IMPRESSION: Soft tissue radiograph  of the neck is within normal limits.  Original Report Authenticated By: Waneta Martins, M.D.     No diagnosis found.    MDM   Pt was given Racemic Epinephrine and Dexamethasone and states that she is a little bit better. She states that she is not feeling that much better. Will move pt to CDU for observation for a couple of hours.      Dorthula Matas, PA 12/22/10 [phone]

## 2010-12-22 NOTE — ED Notes (Signed)
Requesting full liquid diet for pt this am. Pt reports feeling hungry. Uvula is smaller but still red.
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
## 2013-06-21 ENCOUNTER — Encounter (HOSPITAL_COMMUNITY): Payer: Self-pay | Admitting: Emergency Medicine

## 2013-06-21 ENCOUNTER — Emergency Department (HOSPITAL_COMMUNITY)
Admission: EM | Admit: 2013-06-21 | Discharge: 2013-06-21 | Discharge status: Against Medical Advice | Payer: Medicaid Other | Attending: Emergency Medicine | Admitting: Emergency Medicine

## 2013-06-21 DIAGNOSIS — R05 Cough: Secondary | ICD-10-CM | POA: Insufficient documentation

## 2013-06-21 DIAGNOSIS — R0602 Shortness of breath: Secondary | ICD-10-CM | POA: Insufficient documentation

## 2013-06-21 DIAGNOSIS — F17200 Nicotine dependence, unspecified, uncomplicated: Secondary | ICD-10-CM | POA: Insufficient documentation

## 2013-06-21 DIAGNOSIS — R059 Cough, unspecified: Secondary | ICD-10-CM | POA: Insufficient documentation

## 2013-06-21 DIAGNOSIS — R51 Headache: Secondary | ICD-10-CM | POA: Insufficient documentation

## 2013-06-21 LAB — CBC
HEMATOCRIT: 32.2 % — AB (ref 36.0–46.0)
HEMOGLOBIN: 10.3 g/dL — AB (ref 12.0–15.0)
MCH: 23.5 pg — ABNORMAL LOW (ref 26.0–34.0)
MCHC: 32 g/dL (ref 30.0–36.0)
MCV: 73.5 fL — ABNORMAL LOW (ref 78.0–100.0)
Platelets: 291 10*3/uL (ref 150–400)
RBC: 4.38 MIL/uL (ref 3.87–5.11)
RDW: 15.5 % (ref 11.5–15.5)
WBC: 9.6 10*3/uL (ref 4.0–10.5)

## 2013-06-21 LAB — BASIC METABOLIC PANEL
BUN: 6 mg/dL (ref 6–23)
CO2: 26 meq/L (ref 19–32)
Calcium: 8.5 mg/dL (ref 8.4–10.5)
Chloride: 105 mEq/L (ref 96–112)
Creatinine, Ser: 0.86 mg/dL (ref 0.50–1.10)
GFR calc Af Amer: >90 mL/min (ref >90)
GFR calc non Af Amer: 87 mL/min — ABNORMAL LOW (ref >90)
GLUCOSE: 87 mg/dL (ref 70–99)
POTASSIUM: 3.8 meq/L (ref 3.7–5.3)
Sodium: 140 mEq/L (ref 137–147)

## 2013-06-21 LAB — PRO B NATRIURETIC PEPTIDE: Pro B Natriuretic peptide (BNP): 401.8 pg/mL — ABNORMAL HIGH (ref 0–125)

## 2013-06-21 NOTE — ED Notes (Signed)
Patient here with complaint of shortness of breath. Explains that symptoms have been ongoing for 2 years. Has not been seen by doctor in that time. Endorses abdominal pressure, inability to lay flat at night, leg swelling, occasional wet cough, and headache accompanying shortness of breath.

## 2015-10-24 ENCOUNTER — Encounter (HOSPITAL_COMMUNITY): Payer: Self-pay | Admitting: Emergency Medicine

## 2015-10-24 ENCOUNTER — Emergency Department (HOSPITAL_COMMUNITY)
Admission: EM | Admit: 2015-10-24 | Discharge: 2015-10-24 | Disposition: A | Discharge status: Home / Self Care | Payer: Medicaid Other | Attending: Emergency Medicine | Admitting: Emergency Medicine

## 2015-10-24 DIAGNOSIS — F17210 Nicotine dependence, cigarettes, uncomplicated: Secondary | ICD-10-CM | POA: Diagnosis not present

## 2015-10-24 DIAGNOSIS — S76911A Strain of unspecified muscles, fascia and tendons at thigh level, right thigh, initial encounter: Secondary | ICD-10-CM | POA: Diagnosis not present

## 2015-10-24 DIAGNOSIS — Y9341 Activity, dancing: Secondary | ICD-10-CM | POA: Insufficient documentation

## 2015-10-24 DIAGNOSIS — S79921A Unspecified injury of right thigh, initial encounter: Secondary | ICD-10-CM | POA: Diagnosis present

## 2015-10-24 DIAGNOSIS — Y929 Unspecified place or not applicable: Secondary | ICD-10-CM | POA: Diagnosis not present

## 2015-10-24 DIAGNOSIS — Y999 Unspecified external cause status: Secondary | ICD-10-CM | POA: Diagnosis not present

## 2015-10-24 DIAGNOSIS — I10 Essential (primary) hypertension: Secondary | ICD-10-CM | POA: Insufficient documentation

## 2015-10-24 DIAGNOSIS — T148XXA Other injury of unspecified body region, initial encounter: Secondary | ICD-10-CM

## 2015-10-24 DIAGNOSIS — X503XXA Overexertion from repetitive movements, initial encounter: Secondary | ICD-10-CM | POA: Diagnosis not present

## 2015-10-24 HISTORY — DX: Essential (primary) hypertension: I10

## 2015-10-24 LAB — URINE MICROSCOPIC-ADD ON

## 2015-10-24 LAB — I-STAT CHEM 8, ED
BUN: 10 mg/dL (ref 6–20)
CALCIUM ION: 1.14 mmol/L — AB (ref 1.15–1.40)
CHLORIDE: 106 mmol/L (ref 101–111)
Creatinine, Ser: 0.9 mg/dL (ref 0.44–1.00)
GLUCOSE: 88 mg/dL (ref 65–99)
HCT: 38 % (ref 36.0–46.0)
Hemoglobin: 12.9 g/dL (ref 12.0–15.0)
Potassium: 4.1 mmol/L (ref 3.5–5.1)
Sodium: 140 mmol/L (ref 135–145)
TCO2: 22 mmol/L (ref 0–100)

## 2015-10-24 LAB — URINALYSIS, ROUTINE W REFLEX MICROSCOPIC
BILIRUBIN URINE: NEGATIVE
Glucose, UA: NEGATIVE mg/dL
Ketones, ur: NEGATIVE mg/dL
Leukocytes, UA: NEGATIVE
Nitrite: NEGATIVE
PH: 5.5 (ref 5.0–8.0)
Protein, ur: NEGATIVE mg/dL
SPECIFIC GRAVITY, URINE: 1.018 (ref 1.005–1.030)

## 2015-10-24 LAB — POC URINE PREG, ED: Preg Test, Ur: NEGATIVE

## 2015-10-24 MED ORDER — HYDROCHLOROTHIAZIDE 25 MG PO TABS
12.5000 mg | ORAL_TABLET | Freq: Once | ORAL | Status: AC
  Administered 2015-10-24: 12.5 mg via ORAL
  Filled 2015-10-24: qty 1

## 2015-10-24 MED ORDER — HYDROCHLOROTHIAZIDE 12.5 MG PO TABS: 12.5000 mg | ORAL_TABLET | Freq: Every day | ORAL | 1 refills | Status: DC

## 2015-10-24 MED ORDER — IBUPROFEN 600 MG PO TABS: 600.0000 mg | ORAL_TABLET | Freq: Four times a day (QID) | ORAL | 0 refills | Status: DC | PRN

## 2015-10-24 MED ORDER — IBUPROFEN 400 MG PO TABS
600.0000 mg | ORAL_TABLET | Freq: Once | ORAL | Status: AC
  Administered 2015-10-24: 600 mg via ORAL
  Filled 2015-10-24: qty 1

## 2015-10-24 MED ORDER — METHOCARBAMOL 500 MG PO TABS
750.0000 mg | ORAL_TABLET | Freq: Once | ORAL | Status: AC
  Administered 2015-10-24: 750 mg via ORAL
  Filled 2015-10-24: qty 2

## 2015-10-24 MED ORDER — METHOCARBAMOL 500 MG PO TABS
500.0000 mg | ORAL_TABLET | Freq: Two times a day (BID) | ORAL | 0 refills | Status: DC
Start: 2015-10-24 — End: 2017-11-18

## 2015-10-24 NOTE — ED Triage Notes (Signed)
Patient states she has been having urinary incontinence for years.  Patient states now having flank pain.  Patient states yesterday felt a pop in her groin area when she went to urinate.

## 2015-10-24 NOTE — ED Provider Notes (Signed)
[facility]-EMERGENCY DEPT Provider Note   CSN: [id number] Arrival date & time: 10/24/15  0900     History   Chief Complaint Chief Complaint  Patient presents with  . Flank Pain  . Urinary Incontinence    HPI Jamie [name] is a 40 y.o. female.  Pt is a 40 year old female with history of hypertension who presents the ED with complaint of right upper thigh/leg pain, onset last night. Patient reports while she was dancing to try to hold in her while her significant other was using the restroom she felt a pop in her right upper thigh and began having constant pain. She reports having constant aching burning pain to her right upper thigh and lateral hip. Denies radiation. She notes the pain is worse with movement or when laying on her side. Patient states pain is mildly alleviated when she was laying still. Denies taking any medications for her symptoms. Denies any other recent fall or injury. Patient denies urinary incontinence but notes after she has had her last child she has had intermittent urinary urgency but notes she is able to hold her urine until she reaches a restroom. Denies fever, chills, chest pain, difficulty breathing, neck/back pain, abdominal pain, flank pain, nausea, vomiting, diarrhea, constipation, urinary symptoms, vaginal bleeding, vaginal discharge, numbness, tingling, swelling, redness, weakness. Patient denies currently having a PCP.      Past Medical History:  Diagnosis Date  . Hypertension     There are no active problems to display for this patient.   History reviewed. No pertinent surgical history.  OB History    No data available       Home Medications    Prior to Admission medications   Medication Sig Start Date End Date Taking? Authorizing Provider  hydrochlorothiazide (HYDRODIURIL) 12.5 MG tablet Take 1 tablet (12.5 mg total) by mouth daily. 10/24/15   Barrett Henle, PA-C  ibuprofen (ADVIL,MOTRIN) 600 MG tablet Take 1 tablet (600 mg  total) by mouth every 6 (six) hours as needed. 10/24/15   Barrett Henle, PA-C  methocarbamol (ROBAXIN) 500 MG tablet Take 1 tablet (500 mg total) by mouth 2 (two) times daily. 10/24/15   Barrett Henle, PA-C    Family History No family history on file.  Social History Social History  Substance Use Topics  . Smoking status: Current Every Day Smoker    Packs/day: 0.50    Types: Cigarettes  . Smokeless tobacco: Not on file  . Alcohol use No     Allergies   Review of patient's allergies indicates no known allergies.   Review of Systems Review of Systems  Musculoskeletal: Positive for myalgias (right thigh/upper leg).  All other systems reviewed and are negative.    Physical Exam Updated Vital Signs BP 160/87   Pulse 62   Temp 98.1 F (36.7 C) (Oral)   Resp 16   Ht 5\' 3"  (1.6 m)   Wt 99.8 kg   LMP 09/18/2015   SpO2 100%   BMI 38.97 kg/m   Physical Exam  Constitutional: She is oriented to person, place, and time. She appears well-developed and well-nourished. No distress.  HENT:  Head: Normocephalic and atraumatic.  Mouth/Throat: Oropharynx is clear and moist. No oropharyngeal exudate.  Eyes: Conjunctivae and EOM are normal. Right eye exhibits no discharge. Left eye exhibits no discharge. No scleral icterus.  Neck: Normal range of motion. Neck supple.  Cardiovascular: Normal rate, regular rhythm, normal heart sounds and intact distal pulses.   Pulmonary/Chest: Effort normal  and breath sounds normal. No respiratory distress. She has no wheezes. She has no rales. She exhibits no tenderness.  Abdominal: Soft. Bowel sounds are normal. She exhibits no distension and no mass. There is no tenderness. There is no rebound and no guarding. No hernia.  No CVA tenderness.  Musculoskeletal: Normal range of motion. She exhibits tenderness. She exhibits no edema or deformity.  No midline C, T, or L tenderness. Full range of motion of neck and back. Full range of  motion of bilateral upper and lower extremities, with 5/5 strength. FROM of right hip and knee with 5/5 strength. TTP over right proximal IT band, proximal sartorius and proximal quadriceps.  No saddle anesthesia. Sensation intact. 2+ radial and PT pulses. Cap refill <2 seconds. Patient able to stand and ambulate without assistance.    Neurological: She is alert and oriented to person, place, and time. She has normal strength and normal reflexes. No sensory deficit.  Skin: Skin is warm and dry. She is not diaphoretic.  Nursing note and vitals reviewed.    ED Treatments / Results  Labs (all labs ordered are listed, but only abnormal results are displayed) Labs Reviewed  URINALYSIS, ROUTINE W REFLEX MICROSCOPIC (NOT AT [facility]) - Abnormal; Notable for the following:       Result Value   Hgb urine dipstick TRACE (*)    All other components within normal limits  URINE MICROSCOPIC-ADD ON - Abnormal; Notable for the following:    Squamous Epithelial / LPF 0-5 (*)    Bacteria, UA FEW (*)    All other components within normal limits  I-STAT CHEM 8, ED - Abnormal; Notable for the following:    Calcium, Ion 1.14 (*)    All other components within normal limits  POC URINE PREG, ED    EKG  EKG Interpretation None       Radiology No results found.  Procedures Procedures (including critical care time)  Medications Ordered in ED Medications  methocarbamol (ROBAXIN) tablet 750 mg (750 mg Oral Given 10/24/15 1107)  ibuprofen (ADVIL,MOTRIN) tablet 600 mg (600 mg Oral Given 10/24/15 1107)  hydrochlorothiazide (HYDRODIURIL) tablet 12.5 mg (12.5 mg Oral Given 10/24/15 1136)     Initial Impression / Assessment and Plan / ED Course  I have reviewed the triage vital signs and the nursing notes.  Pertinent labs & imaging results that were available during my care of the patient were reviewed by me and considered in my medical decision making (see chart for details).  Clinical Course    Pt  presents with right upper leg/thigh pain that occurred last night while she was dancing. Denies any fall or other recent injury. Denies any other sxs. VSS. Exam revealed TTP over right hip flexor muscles, no bony tenderness, FROM of right hip with 5/5 strength. No evidence of injury. Abdominal exam benign. No back/flank tenderness. Suspect pt's sxs are likely due to muscle strain. Plan to d/c pt home with NSAID and muscle relaxant with symptomatic tx.   Patient reports history of hypertension but notes she has been noncompliant with her medications at home. She states she was started on medications over a year ago by her prior PCP but denies taking the medications. Patient denies headache, visual changes, lightheadedness, dizziness, shortness of breath, chest pain, abdominal pain, numbness, tingling, weakness.  Patients blood pressure 184/112 on initial evaluation. No signs of hypertensive emergency or urgency at this time. Chem-8 and UA unremarkable. Pt given 12.5mg  HCTZ in the ED. Repeat vitals showed  BP 151/88. Reevaluation patient continues to deny any symptoms at this time. Plan to d/c pt home with Rx for 12.5mg  HCTZ daily.  Advised patient to follow up with a PCP in one week to have her blood pressure rechecked and to establish care for further management of her HTN. Pt given resources for PCP.   Final Clinical Impressions(s) / ED Diagnoses   Final diagnoses:  Muscle strain  Hypertension, unspecified type    New Prescriptions New Prescriptions   HYDROCHLOROTHIAZIDE (HYDRODIURIL) 12.5 MG TABLET    Take 1 tablet (12.5 mg total) by mouth daily.   IBUPROFEN (ADVIL,MOTRIN) 600 MG TABLET    Take 1 tablet (600 mg total) by mouth every 6 (six) hours as needed.   METHOCARBAMOL (ROBAXIN) 500 MG TABLET    Take 1 tablet (500 mg total) by mouth 2 (two) times daily.     Satira Sark [place], [place] 10/24/15 1311    Benjiman Core, MD 10/26/15 [phone]

## 2015-10-24 NOTE — Discharge Instructions (Signed)
Take your medications as prescribed as for pain relief. I recommend resting and refraining from doing any heavy lifting or repetitive movements of exacerbate your pain for the next few days. You may also apply ice to affected area for 15 minutes 3-4 times daily for additional pain relief. I recommend taking your blood pressure medication once daily until you follow up with a primary care provider. Please follow up with a primary care provider from the Resource Guide provided below within the next week to have your blood pressure rechecked and establish care for further management of your high blood pressure. Please return to the Emergency Department if symptoms worsen or new onset of fever, headache, lightheadedness, dizziness, visual changes, chest pain, difficulty breathing, abdominal pain, vomiting, numbness, tingling, weakness.

## 2017-10-13 ENCOUNTER — Emergency Department (HOSPITAL_COMMUNITY): Payer: Medicaid Other

## 2017-10-13 ENCOUNTER — Encounter (HOSPITAL_COMMUNITY): Payer: Self-pay

## 2017-10-13 ENCOUNTER — Other Ambulatory Visit: Payer: Self-pay

## 2017-10-13 ENCOUNTER — Emergency Department (HOSPITAL_COMMUNITY)
Admission: EM | Admit: 2017-10-13 | Discharge: 2017-10-13 | Disposition: A | Discharge status: Home / Self Care | Payer: Medicaid Other | Attending: Emergency Medicine | Admitting: Emergency Medicine

## 2017-10-13 DIAGNOSIS — Y92410 Unspecified street and highway as the place of occurrence of the external cause: Secondary | ICD-10-CM | POA: Insufficient documentation

## 2017-10-13 DIAGNOSIS — Y939 Activity, unspecified: Secondary | ICD-10-CM | POA: Diagnosis not present

## 2017-10-13 DIAGNOSIS — I10 Essential (primary) hypertension: Secondary | ICD-10-CM

## 2017-10-13 DIAGNOSIS — F17210 Nicotine dependence, cigarettes, uncomplicated: Secondary | ICD-10-CM | POA: Insufficient documentation

## 2017-10-13 DIAGNOSIS — Z79899 Other long term (current) drug therapy: Secondary | ICD-10-CM | POA: Insufficient documentation

## 2017-10-13 DIAGNOSIS — Y998 Other external cause status: Secondary | ICD-10-CM | POA: Insufficient documentation

## 2017-10-13 DIAGNOSIS — R1030 Lower abdominal pain, unspecified: Secondary | ICD-10-CM | POA: Insufficient documentation

## 2017-10-13 LAB — COMPREHENSIVE METABOLIC PANEL
ALT: 11 U/L (ref 0–44)
AST: 14 U/L — ABNORMAL LOW (ref 15–41)
Albumin: 3.4 g/dL — ABNORMAL LOW (ref 3.5–5.0)
Alkaline Phosphatase: 63 U/L (ref 38–126)
Anion gap: 7 (ref 5–15)
BUN: 8 mg/dL (ref 6–20)
CO2: 24 mmol/L (ref 22–32)
CREATININE: 0.91 mg/dL (ref 0.44–1.00)
Calcium: 8.4 mg/dL — ABNORMAL LOW (ref 8.9–10.3)
Chloride: 104 mmol/L (ref 98–111)
Glucose, Bld: 83 mg/dL (ref 70–99)
POTASSIUM: 3.1 mmol/L — AB (ref 3.5–5.1)
Sodium: 135 mmol/L (ref 135–145)
Total Bilirubin: 0.2 mg/dL — ABNORMAL LOW (ref 0.3–1.2)
Total Protein: 7.2 g/dL (ref 6.5–8.1)

## 2017-10-13 LAB — CBC WITH DIFFERENTIAL/PLATELET
Abs Immature Granulocytes: 0 10*3/uL (ref 0.0–0.1)
Basophils Absolute: 0 10*3/uL (ref 0.0–0.1)
Basophils Relative: 0 %
EOS ABS: 0.1 10*3/uL (ref 0.0–0.7)
Eosinophils Relative: 2 %
HEMATOCRIT: 33 % — AB (ref 36.0–46.0)
Hemoglobin: 10.3 g/dL — ABNORMAL LOW (ref 12.0–15.0)
Immature Granulocytes: 0 %
LYMPHS ABS: 2.6 10*3/uL (ref 0.7–4.0)
Lymphocytes Relative: 31 %
MCH: 23.5 pg — ABNORMAL LOW (ref 26.0–34.0)
MCHC: 31.2 g/dL (ref 30.0–36.0)
MCV: 75.2 fL — AB (ref 78.0–100.0)
Monocytes Absolute: 0.6 10*3/uL (ref 0.1–1.0)
Monocytes Relative: 7 %
Neutro Abs: 5.2 10*3/uL (ref 1.7–7.7)
Neutrophils Relative %: 60 %
Platelets: 254 10*3/uL (ref 150–400)
RBC: 4.39 MIL/uL (ref 3.87–5.11)
RDW: 15.6 % — AB (ref 11.5–15.5)
WBC: 8.7 10*3/uL (ref 4.0–10.5)

## 2017-10-13 LAB — URINALYSIS, ROUTINE W REFLEX MICROSCOPIC
Bilirubin Urine: NEGATIVE
Glucose, UA: NEGATIVE mg/dL
Ketones, ur: NEGATIVE mg/dL
NITRITE: POSITIVE — AB
Protein, ur: NEGATIVE mg/dL
SPECIFIC GRAVITY, URINE: 1.013 (ref 1.005–1.030)
pH: 6 (ref 5.0–8.0)

## 2017-10-13 LAB — I-STAT BETA HCG BLOOD, ED (MC, WL, AP ONLY): I-stat hCG, quantitative: <5 m[IU]/mL (ref <5)
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
## 2017-10-13 MED ORDER — MORPHINE SULFATE (PF) 4 MG/ML IV SOLN
4.0000 mg | Freq: Once | INTRAVENOUS | Status: AC
  Administered 2017-10-13: 4 mg via INTRAVENOUS
  Filled 2017-10-13: qty 1

## 2017-10-13 MED ORDER — HYDRALAZINE HCL 20 MG/ML IJ SOLN
5.0000 mg | Freq: Once | INTRAMUSCULAR | Status: AC
  Administered 2017-10-13: 5 mg via INTRAVENOUS
  Filled 2017-10-13: qty 1

## 2017-10-13 MED ORDER — IOHEXOL 300 MG/ML  SOLN
100.0000 mL | Freq: Once | INTRAMUSCULAR | Status: AC | PRN
  Administered 2017-10-13: 100 mL via INTRAVENOUS

## 2017-10-13 MED ORDER — LISINOPRIL 20 MG PO TABS: 20.0000 mg | ORAL_TABLET | Freq: Every day | ORAL | 0 refills | Status: DC
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
## 2017-11-18 ENCOUNTER — Encounter (HOSPITAL_COMMUNITY): Payer: Self-pay | Admitting: Emergency Medicine

## 2017-11-18 ENCOUNTER — Emergency Department (HOSPITAL_COMMUNITY): Payer: Medicaid Other

## 2017-11-18 ENCOUNTER — Other Ambulatory Visit: Payer: Self-pay

## 2017-11-18 ENCOUNTER — Emergency Department (HOSPITAL_COMMUNITY)
Admission: EM | Admit: 2017-11-18 | Discharge: 2017-11-18 | Disposition: A | Discharge status: Home / Self Care | Payer: Medicaid Other | Attending: Emergency Medicine | Admitting: Emergency Medicine

## 2017-11-18 DIAGNOSIS — Z79899 Other long term (current) drug therapy: Secondary | ICD-10-CM | POA: Diagnosis not present

## 2017-11-18 DIAGNOSIS — F17210 Nicotine dependence, cigarettes, uncomplicated: Secondary | ICD-10-CM | POA: Insufficient documentation

## 2017-11-18 DIAGNOSIS — R0600 Dyspnea, unspecified: Secondary | ICD-10-CM | POA: Insufficient documentation

## 2017-11-18 DIAGNOSIS — R51 Headache: Secondary | ICD-10-CM | POA: Diagnosis present

## 2017-11-18 DIAGNOSIS — I10 Essential (primary) hypertension: Secondary | ICD-10-CM | POA: Insufficient documentation

## 2017-11-18 LAB — POC URINE PREG, ED: PREG TEST UR: NEGATIVE
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
## 2017-11-18 MED ORDER — LISINOPRIL 20 MG PO TABS
20.0000 mg | ORAL_TABLET | Freq: Once | ORAL | Status: AC
  Administered 2017-11-18: 20 mg via ORAL
  Filled 2017-11-18: qty 1

## 2017-11-18 MED ORDER — METHOCARBAMOL 500 MG PO TABS: 500.0000 mg | ORAL_TABLET | Freq: Two times a day (BID) | ORAL | 0 refills | Status: DC

## 2017-11-18 MED ORDER — ACETAMINOPHEN 325 MG PO TABS
650.0000 mg | ORAL_TABLET | Freq: Once | ORAL | Status: AC
  Administered 2017-11-18: 650 mg via ORAL
  Filled 2017-11-18: qty 2

## 2017-11-18 MED ORDER — METHOCARBAMOL 500 MG PO TABS: 500.0000 mg | ORAL_TABLET | Freq: Two times a day (BID) | ORAL | 0 refills | Status: AC

## 2017-11-18 NOTE — ED Notes (Signed)
Pt in treatment room. She is upset and talking on phone.
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
## 2017-11-18 NOTE — Discharge Instructions (Addendum)
I have prescribed muscle relaxers for your pain, please do not drink or drive while taking this medications as they can make you drowsy.  Please follow-up with PCP in 1 week for reevaluation of your symptoms.  You experience any bowel or bladder incontinence, fever, worsening in your symptoms please return to the ED. ° °

## 2018-04-14 ENCOUNTER — Other Ambulatory Visit: Payer: Self-pay

## 2018-04-14 ENCOUNTER — Ambulatory Visit (HOSPITAL_COMMUNITY)
Admission: EM | Admit: 2018-04-14 | Discharge: 2018-04-14 | Disposition: A | Discharge status: Home / Self Care | Payer: Medicaid Other | Attending: Family Medicine | Admitting: Family Medicine

## 2018-04-14 ENCOUNTER — Encounter (HOSPITAL_COMMUNITY): Payer: Self-pay

## 2018-04-14 DIAGNOSIS — R1111 Vomiting without nausea: Secondary | ICD-10-CM

## 2018-04-14 NOTE — ED Triage Notes (Signed)
Pt states she missed  work yesterday because she vomited yesterday. Pt states she needs a work note.

## 2018-04-14 NOTE — ED Provider Notes (Signed)
[facility]   [id number] 04/14/18 Arrival Time: 1058  ASSESSMENT & PLAN:  1. Non-intractable vomiting without nausea, unspecified vomiting type    No signs of a contagious illness. Work note provided.  Reviewed expectations re: course of current medical issues. Questions answered. Outlined signs and symptoms indicating need for more acute intervention. Patient verbalized understanding. After Visit Summary given.   SUBJECTIVE: History from: patient.  Jamie [name] is a 43 y.o. female who reports being at work and vomiting x 1 after exerting herself. Needs a note to return to work. H/O similar in the past with significant exertion. "I have to throw up if I can't catch my breath." No fever. No diarrhea. No new medications. No abdominal pain.  Patient's last menstrual period was 04/14/2018.  Past Surgical History:  Procedure Laterality Date  . WISDOM TOOTH EXTRACTION      ROS: As per HPI.  OBJECTIVE:  Vitals:   04/14/18 1118 04/14/18 1119  Pulse: 80   Resp: 18   Temp: 98.1 F (36.7 C)   TempSrc: Oral   SpO2: 100%   Weight:  97.5 kg    General appearance: alert; no distress Oropharynx: moist Lungs: clear to auscultation bilaterally; unlabored Heart: regular rate and rhythm Abdomen: soft; non-distended; no significant abdominal tenderness; no guarding or rebound tenderness Back: no CVA tenderness Extremities: no edema; symmetrical with no gross deformities Skin: warm; dry Psychological: alert and cooperative; normal mood and affect   No Known Allergies                                             Past Medical History:  Diagnosis Date  . Hypertension    Social History   Socioeconomic History  . Marital status: Single    Spouse name: Not on file  . Number of children: Not on file  . Years of education: Not on file  . Highest education level: Not on file  Occupational History  . Not on file  Social Needs  . Financial resource strain: Not on  file  . Food insecurity:    Worry: Not on file    Inability: Not on file  . Transportation needs:    Medical: Not on file    Non-medical: Not on file  Tobacco Use  . Smoking status: Current Every Day Smoker    Packs/day: 0.50    Types: Cigarettes  . Smokeless tobacco: Never Used  Substance and Sexual Activity  . Alcohol use: No  . Drug use: Yes    Types: Marijuana    Comment: occ  . Sexual activity: Not on file  Lifestyle  . Physical activity:    Days per week: Not on file    Minutes per session: Not on file  . Stress: Not on file  Relationships  . Social connections:    Talks on phone: Not on file    Gets together: Not on file    Attends religious service: Not on file    Active member of club or organization: Not on file    Attends meetings of clubs or organizations: Not on file    Relationship status: Not on file  . Intimate partner violence:    Fear of current or ex partner: Not on file    Emotionally abused: Not on file    Physically abused: Not on file    Forced sexual  activity: Not on file  Other Topics Concern  . Not on file  Social History Narrative  . Not on file   Family History  Problem Relation Age of Onset  . Hypertension Mother   . Diabetes Mother      Mardella LaymanHagler, Neidy Guerrieri, MD 04/14/18 1139

## 2018-09-02 ENCOUNTER — Other Ambulatory Visit: Payer: Self-pay

## 2018-09-02 DIAGNOSIS — Z20822 Contact with and (suspected) exposure to covid-19: Secondary | ICD-10-CM

## 2018-09-03 LAB — NOVEL CORONAVIRUS, NAA: SARS-CoV-2, NAA: NOT DETECTED

## 2018-12-17 ENCOUNTER — Encounter (HOSPITAL_COMMUNITY): Payer: Self-pay | Admitting: Emergency Medicine

## 2018-12-17 ENCOUNTER — Other Ambulatory Visit: Payer: Self-pay

## 2018-12-17 ENCOUNTER — Inpatient Hospital Stay (HOSPITAL_COMMUNITY)
Admission: EM | Admit: 2018-12-17 | Discharge: 2018-12-18 | DRG: 066 | Disposition: A | Discharge status: Home / Self Care | Payer: Medicaid Other | Attending: Internal Medicine | Admitting: Internal Medicine

## 2018-12-17 ENCOUNTER — Emergency Department (HOSPITAL_COMMUNITY): Payer: Medicaid Other

## 2018-12-17 DIAGNOSIS — N390 Urinary tract infection, site not specified: Secondary | ICD-10-CM | POA: Diagnosis present

## 2018-12-17 DIAGNOSIS — Z720 Tobacco use: Secondary | ICD-10-CM

## 2018-12-17 DIAGNOSIS — I639 Cerebral infarction, unspecified: Principal | ICD-10-CM

## 2018-12-17 DIAGNOSIS — R8290 Unspecified abnormal findings in urine: Secondary | ICD-10-CM

## 2018-12-17 DIAGNOSIS — R471 Dysarthria and anarthria: Secondary | ICD-10-CM | POA: Diagnosis present

## 2018-12-17 DIAGNOSIS — R29702 NIHSS score 2: Secondary | ICD-10-CM | POA: Diagnosis present

## 2018-12-17 DIAGNOSIS — G459 Transient cerebral ischemic attack, unspecified: Secondary | ICD-10-CM

## 2018-12-17 DIAGNOSIS — Z833 Family history of diabetes mellitus: Secondary | ICD-10-CM

## 2018-12-17 DIAGNOSIS — Z20828 Contact with and (suspected) exposure to other viral communicable diseases: Secondary | ICD-10-CM | POA: Diagnosis present

## 2018-12-17 DIAGNOSIS — Z8249 Family history of ischemic heart disease and other diseases of the circulatory system: Secondary | ICD-10-CM

## 2018-12-17 DIAGNOSIS — Z9119 Patient's noncompliance with other medical treatment and regimen: Secondary | ICD-10-CM

## 2018-12-17 DIAGNOSIS — R270 Ataxia, unspecified: Secondary | ICD-10-CM | POA: Diagnosis present

## 2018-12-17 DIAGNOSIS — F17210 Nicotine dependence, cigarettes, uncomplicated: Secondary | ICD-10-CM | POA: Diagnosis present

## 2018-12-17 DIAGNOSIS — I6381 Other cerebral infarction due to occlusion or stenosis of small artery: Principal | ICD-10-CM | POA: Diagnosis present

## 2018-12-17 DIAGNOSIS — I10 Essential (primary) hypertension: Secondary | ICD-10-CM

## 2018-12-17 HISTORY — DX: Cerebral infarction, unspecified: I63.9

## 2018-12-17 HISTORY — DX: Tobacco use: Z72.0

## 2018-12-17 LAB — I-STAT CHEM 8, ED
BUN: 8 mg/dL (ref 6–20)
Calcium, Ion: 1.13 mmol/L — ABNORMAL LOW (ref 1.15–1.40)
Chloride: 106 mmol/L (ref 98–111)
Creatinine, Ser: 0.9 mg/dL (ref 0.44–1.00)
Glucose, Bld: 87 mg/dL (ref 70–99)
HCT: 37 % (ref 36.0–46.0)
Hemoglobin: 12.6 g/dL (ref 12.0–15.0)
Potassium: 3.8 mmol/L (ref 3.5–5.1)
Sodium: 140 mmol/L (ref 135–145)
TCO2: 26 mmol/L (ref 22–32)

## 2018-12-17 LAB — COMPREHENSIVE METABOLIC PANEL
ALT: 13 U/L (ref 0–44)
AST: 17 U/L (ref 15–41)
Albumin: 3.5 g/dL (ref 3.5–5.0)
Alkaline Phosphatase: 80 U/L (ref 38–126)
Anion gap: 9 (ref 5–15)
BUN: 7 mg/dL (ref 6–20)
CO2: 24 mmol/L (ref 22–32)
Calcium: 8.8 mg/dL — ABNORMAL LOW (ref 8.9–10.3)
Chloride: 105 mmol/L (ref 98–111)
Creatinine, Ser: 0.86 mg/dL (ref 0.44–1.00)
GFR calc Af Amer: >60 mL/min (ref >60)
GFR calc non Af Amer: >60 mL/min (ref >60)
Glucose, Bld: 89 mg/dL (ref 70–99)
Potassium: 3.7 mmol/L (ref 3.5–5.1)
Sodium: 138 mmol/L (ref 135–145)
Total Bilirubin: 0.3 mg/dL (ref 0.3–1.2)
Total Protein: 7.6 g/dL (ref 6.5–8.1)

## 2018-12-17 LAB — URINALYSIS, ROUTINE W REFLEX MICROSCOPIC
Bilirubin Urine: NEGATIVE
Glucose, UA: NEGATIVE mg/dL
Ketones, ur: NEGATIVE mg/dL
Leukocytes,Ua: NEGATIVE
Nitrite: POSITIVE — AB
Protein, ur: NEGATIVE mg/dL
Specific Gravity, Urine: 1.016 (ref 1.005–1.030)
pH: 6 (ref 5.0–8.0)

## 2018-12-17 LAB — RAPID URINE DRUG SCREEN, HOSP PERFORMED
Amphetamines: NOT DETECTED
Barbiturates: NOT DETECTED
Benzodiazepines: NOT DETECTED
Cocaine: NOT DETECTED
Opiates: NOT DETECTED
Tetrahydrocannabinol: POSITIVE — AB

## 2018-12-17 LAB — DIFFERENTIAL
Abs Immature Granulocytes: 0.02 10*3/uL (ref 0.00–0.07)
Basophils Absolute: 0 10*3/uL (ref 0.0–0.1)
Basophils Relative: 0 %
Eosinophils Absolute: 0.2 10*3/uL (ref 0.0–0.5)
Eosinophils Relative: 2 %
Immature Granulocytes: 0 %
Lymphocytes Relative: 31 %
Lymphs Abs: 2.8 10*3/uL (ref 0.7–4.0)
Monocytes Absolute: 0.7 10*3/uL (ref 0.1–1.0)
Monocytes Relative: 8 %
Neutro Abs: 5.5 10*3/uL (ref 1.7–7.7)
Neutrophils Relative %: 59 %

## 2018-12-17 LAB — CBC
HCT: 36 % (ref 36.0–46.0)
Hemoglobin: 11.1 g/dL — ABNORMAL LOW (ref 12.0–15.0)
MCH: 23.4 pg — ABNORMAL LOW (ref 26.0–34.0)
MCHC: 30.8 g/dL (ref 30.0–36.0)
MCV: 75.9 fL — ABNORMAL LOW (ref 80.0–100.0)
Platelets: 317 10*3/uL (ref 150–400)
RBC: 4.74 MIL/uL (ref 3.87–5.11)
RDW: 16.5 % — ABNORMAL HIGH (ref 11.5–15.5)
WBC: 9.3 10*3/uL (ref 4.0–10.5)
nRBC: 0 % (ref 0.0–0.2)

## 2018-12-17 LAB — PROTIME-INR
INR: 1 (ref 0.8–1.2)
Prothrombin Time: 12.6 seconds (ref 11.4–15.2)

## 2018-12-17 LAB — I-STAT BETA HCG BLOOD, ED (MC, WL, AP ONLY): I-stat hCG, quantitative: <5 m[IU]/mL (ref <5)

## 2018-12-17 LAB — APTT: aPTT: 29 seconds (ref 24–36)

## 2018-12-17 IMAGING — CT CT HEAD W/O CM
4 series · 17 of 47 positions shown, 19 images · non-contrast
Comparison: None.

CLINICAL DATA: Speech disturbance beginning 8 hours ago. Balance
disturbance.

EXAM:
CT HEAD WITHOUT CONTRAST
TECHNIQUE: Contiguous axial images were obtained from the base of the skull
through the vertex without intravenous contrast.

[Series 2: head wo · axial · 0.42mm/px · z∈[+1345,+1470]mm · 7 of 35 slices shown, 9 images]
[im 5/35  brain]
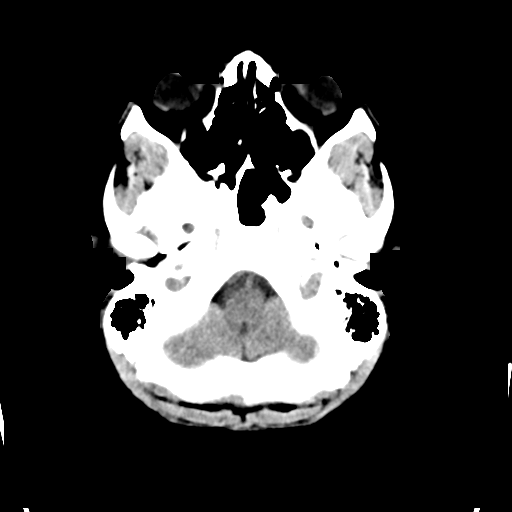
[im 5/35  bone]
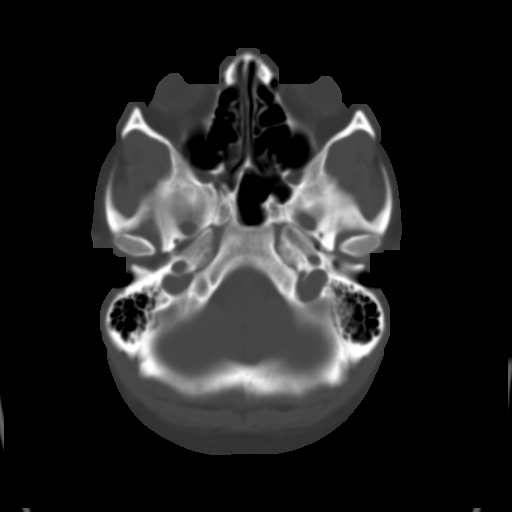
[im 9/35  brain]
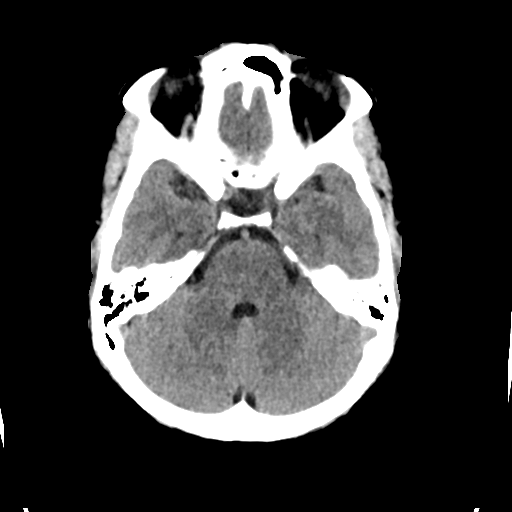
[im 13/35  brain]
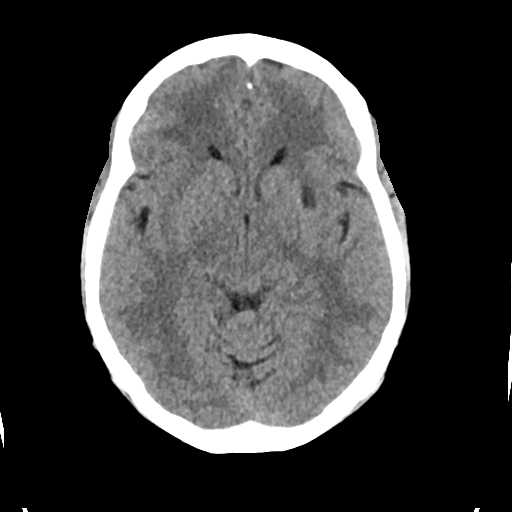
[im 18/35  brain]
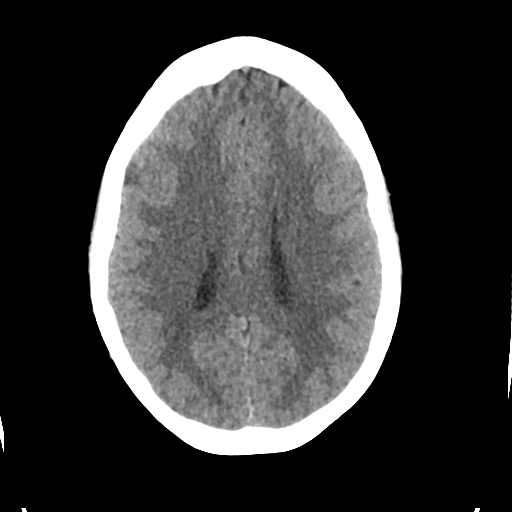
[im 22/35  brain]
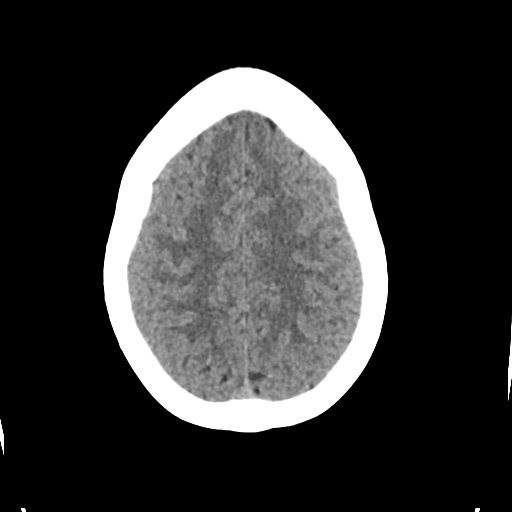
[im 22/35  bone]
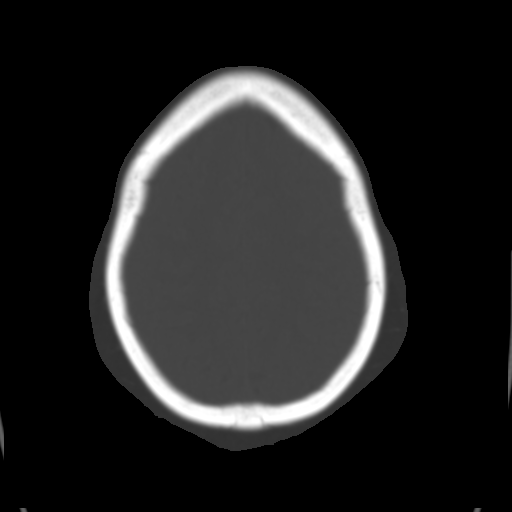
[im 26/35  brain]
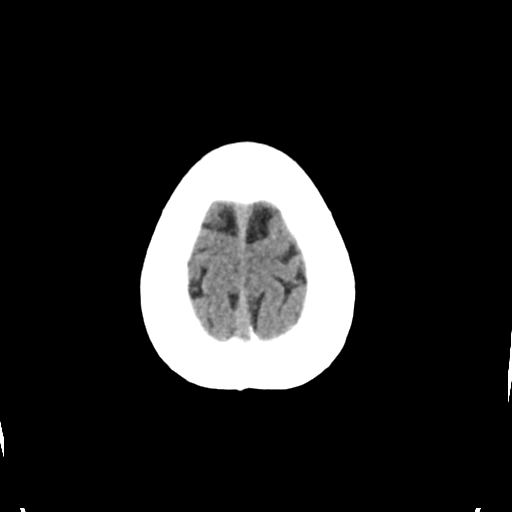
[im 30/35  brain]
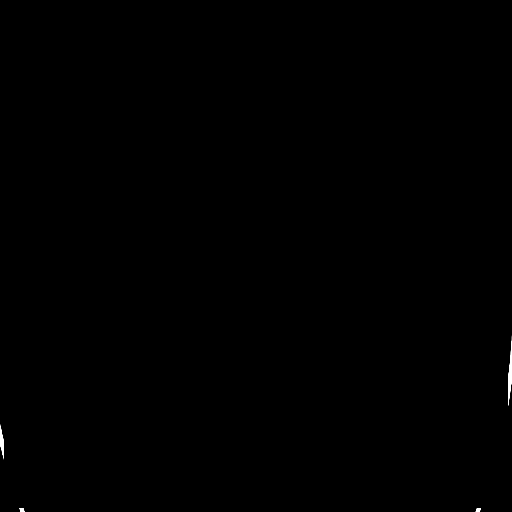

[Series 3: head bone · axial · 0.42mm/px · z∈[+1341,+1401]mm · 4 of 86 slices shown]
[im 9/86  bone]
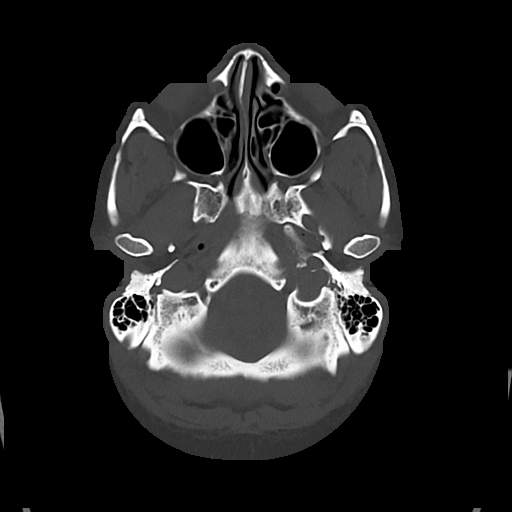
[im 18/86  bone]
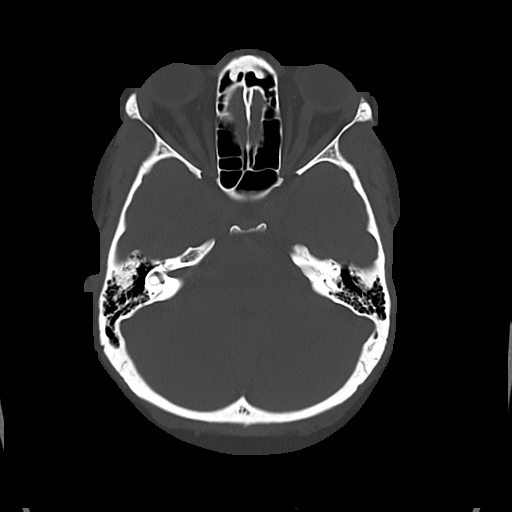
[im 26/86  bone]
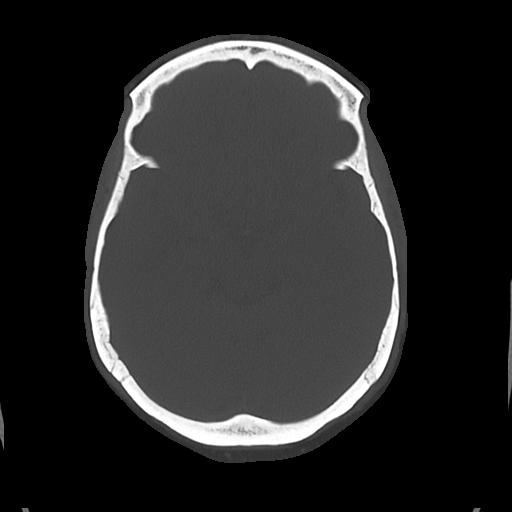
[im 39/86  bone]
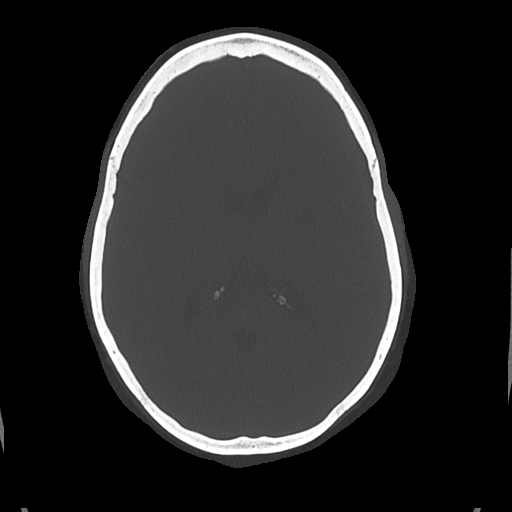

[Series 4: cor soft · coronal · 0.33mm/px · 3 of 71 slices shown]
[im 24/71  brain]
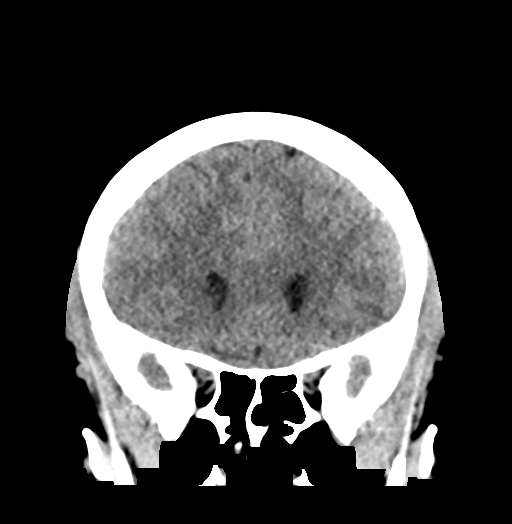
[im 32/71  brain]
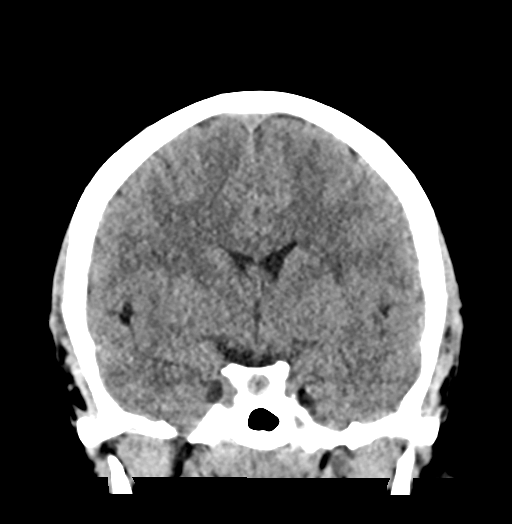
[im 39/71  brain]
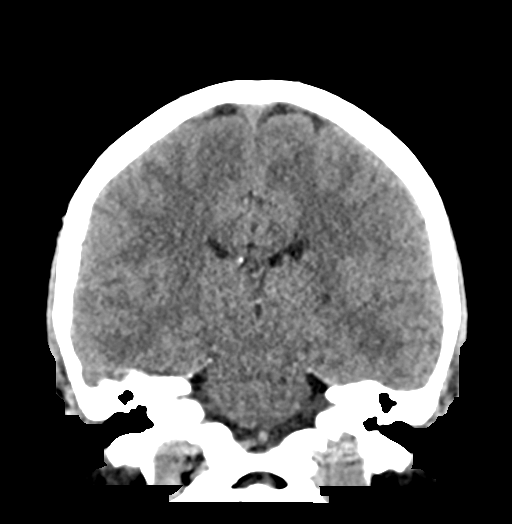

[Series 5: sag soft · sagittal · 0.34mm/px · 3 of 57 slices shown]
[im 19/57  brain]
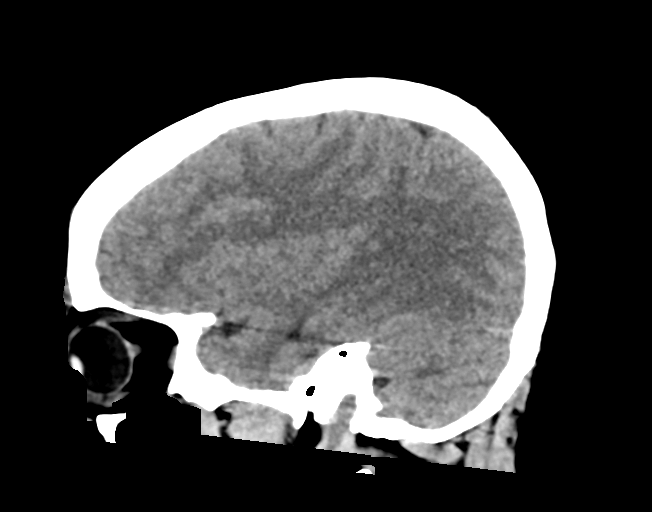
[im 29/57  brain]
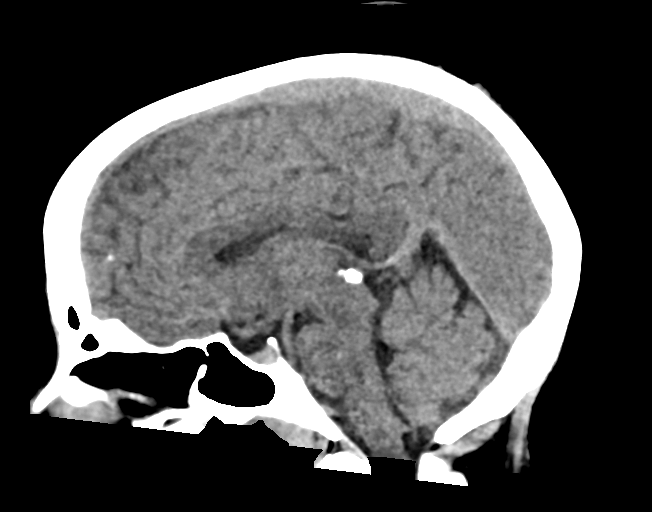
[im 38/57  brain]
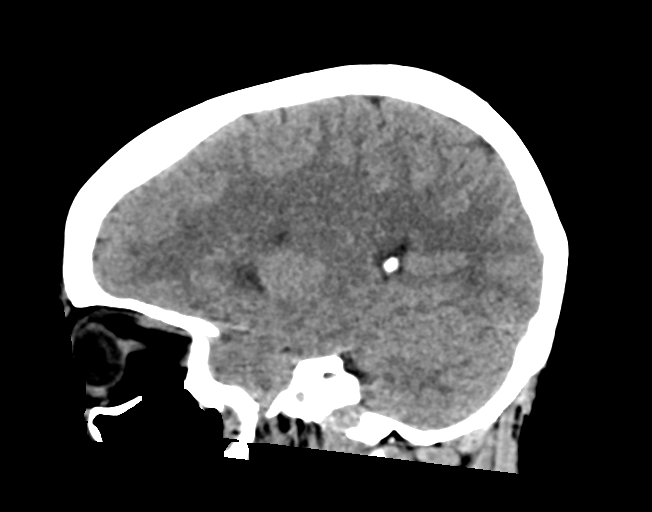

[17 of 47 positions shown; findings below may reference images not displayed]

FINDINGS: Brain: The brainstem and cerebellum are normal. Low-density in the
left basal ganglia and anterior limb internal capsule consistent
with an old infarction. Old white matter infarction in the corona
radiography on the left. No sign of acute infarction, mass lesion,
hemorrhage, hydrocephalus or extra-axial collection.

Vascular: No abnormal vascular finding.

Skull: Normal

Sinuses/Orbits: Clear/normal

Other: None
IMPRESSION: No acute finding by CT. Old left basal ganglia and anterior limb
internal capsule infarction. Old white matter infarction in the
corona radiography on the left.

## 2018-12-17 MED ORDER — SODIUM CHLORIDE 0.9% FLUSH: 3.0000 mL | Freq: Once | INTRAVENOUS | Status: DC

## 2018-12-17 MED ORDER — SODIUM CHLORIDE 0.9 % IV SOLN
1.0000 g | Freq: Once | INTRAVENOUS | Status: AC
  Administered 2018-12-18: 1 g via INTRAVENOUS
  Filled 2018-12-17: qty 10

## 2018-12-17 MED ORDER — PROCHLORPERAZINE EDISYLATE 10 MG/2ML IJ SOLN
10.0000 mg | Freq: Once | INTRAMUSCULAR | Status: AC
  Administered 2018-12-17: 10 mg via INTRAVENOUS
  Filled 2018-12-17: qty 2

## 2018-12-17 MED ORDER — LABETALOL HCL 5 MG/ML IV SOLN: 10.0000 mg | Freq: Once | INTRAVENOUS | Status: DC

## 2018-12-17 MED ORDER — CLOPIDOGREL BISULFATE 75 MG PO TABS
75.0000 mg | ORAL_TABLET | Freq: Every day | ORAL | Status: DC
  Administered 2018-12-18: 10:00:00 75 mg via ORAL
  Filled 2018-12-17: qty 1

## 2018-12-17 MED ORDER — DIPHENHYDRAMINE HCL 50 MG/ML IJ SOLN
12.5000 mg | Freq: Once | INTRAMUSCULAR | Status: AC
  Administered 2018-12-17: 12.5 mg via INTRAVENOUS
  Filled 2018-12-17: qty 1

## 2018-12-17 MED ORDER — CLOPIDOGREL BISULFATE 300 MG PO TABS
300.0000 mg | ORAL_TABLET | Freq: Once | ORAL | Status: AC
  Administered 2018-12-18: 02:00:00 300 mg via ORAL
  Filled 2018-12-17: qty 1

## 2018-12-17 MED ORDER — ACETAMINOPHEN 325 MG PO TABS
650.0000 mg | ORAL_TABLET | Freq: Once | ORAL | Status: AC
  Administered 2018-12-17: 23:00:00 650 mg via ORAL
  Filled 2018-12-17: qty 2

## 2018-12-17 NOTE — ED Triage Notes (Signed)
Pt reports taking a nap this morning around 9 and woke up around 12. Reports waking up with abnormal speech and feels off balance. Pt able to walk in waiting room with no difficulty.

## 2018-12-17 NOTE — ED Provider Notes (Addendum)
[facility] Provider Note   CSN: [id number] Arrival date & time: 12/17/18  1808     History No chief complaint on file.   Jamie [name] is a 43 y.o. female history of hypertension.  Patient reports that she was feeling well today and dropped her children off at school, she returned home to take a nap in her normal state of health.  She received a phone call at noon from her husband and at that time noticed that she was having slurred speech.  She reports this was new for her and she has never experienced this previously.  She got up from the couch and noticed that she was off balance and stumbling towards her left side, this was also new for her starting at noon today.  Symptoms have been continuous since onset.  Only other concern for patient today is that she has developed a mild headache since ED arrival, she reports she was headache free for multiple hours after onset of her symptoms.  Denies fever/chills, fall/injury, recent illness, vision changes, neck pain, chest pain/shortness of breath, abdominal pain, nausea/vomiting, diarrhea, numbness/weakness, tingling, swelling/color change or any additional concerns.  HPI     Past Medical History:  Diagnosis Date  . Hypertension     There are no problems to display for this patient.   Past Surgical History:  Procedure Laterality Date  . WISDOM TOOTH EXTRACTION       OB History   No obstetric history on file.     Family History  Problem Relation Age of Onset  . Hypertension Mother   . Diabetes Mother     Social History   Tobacco Use  . Smoking status: Current Every Day Smoker    Packs/day: 0.50    Types: Cigarettes  . Smokeless tobacco: Never Used  Substance Use Topics  . Alcohol use: No  . Drug use: Yes    Types: Marijuana    Comment: occ    Home Medications Prior to Admission medications   Medication Sig Start Date End Date Taking? Authorizing Provider    hydrochlorothiazide (HYDRODIURIL) 12.5 MG tablet Take 1 tablet (12.5 mg total) by mouth daily. Patient not taking: Reported on 12/17/2018 10/24/15   Barrett HenleNadeau, Nicole Elizabeth, PA-C  ibuprofen (ADVIL,MOTRIN) 600 MG tablet Take 1 tablet (600 mg total) by mouth every 6 (six) hours as needed. Patient not taking: Reported on 10/13/2017 10/24/15   Barrett HenleNadeau, Nicole Elizabeth, PA-C  lisinopril (PRINIVIL,ZESTRIL) 20 MG tablet Take 1 tablet (20 mg total) by mouth daily. Patient not taking: Reported on 12/17/2018 10/13/17   Bethann BerkshireZammit, Joseph, MD    Allergies    Patient has no known allergies.  Review of Systems   Review of Systems Ten systems are reviewed and are negative for acute change except as noted in the HPI  Physical Exam Updated Vital Signs BP (!) 184/102   Pulse 66   Temp 98.2 F (36.8 C) (Oral)   Resp (!) 24   SpO2 100%   Physical Exam Constitutional:      General: She is not in acute distress.    Appearance: Normal appearance. She is well-developed. She is obese. She is not ill-appearing or diaphoretic.  HENT:     Head: Normocephalic and atraumatic.     Right Ear: External ear normal.     Left Ear: External ear normal.     Nose: Nose normal.  Eyes:     General: Vision grossly intact. Gaze aligned appropriately.  Pupils: Pupils are equal, round, and reactive to light.  Neck:     Trachea: Trachea and phonation normal. No tracheal deviation.  Cardiovascular:     Rate and Rhythm: Normal rate and regular rhythm.     Heart sounds: Normal heart sounds.  Pulmonary:     Effort: Pulmonary effort is normal. No respiratory distress.     Breath sounds: Normal breath sounds.  Abdominal:     General: There is no distension.     Palpations: Abdomen is soft.     Tenderness: There is no abdominal tenderness. There is no guarding or rebound.  Musculoskeletal:        General: Normal range of motion.     Cervical back: Normal range of motion.  Skin:    General: Skin is warm and dry.   Neurological:     Mental Status: She is alert.     GCS: GCS eye subscore is 4. GCS verbal subscore is 5. GCS motor subscore is 6.     Comments: Mental Status: Alert, oriented, thought content appropriate, able to give a coherent history. Mild dysphasia present. Able to follow 2 step commands without difficulty. Cranial Nerves: II: Peripheral visual fields grossly normal, pupils equal, round, reactive to light III,IV, VI: ptosis not present, extra-ocular motions intact bilaterally V,VII: smile symmetric, eyebrows raise symmetric, facial light touch sensation equal VIII: hearing grossly normal to voice X: uvula elevates symmetrically XI: bilateral shoulder shrug symmetric and strong XII: midline tongue extension without fassiculations Motor: Normal tone. 5/5 strength in upper and lower extremities bilaterally including strong and equal grip strength and dorsiflexion/plantar flexion Sensory: Sensation intact to light touch in all extremities. Patient leaning leftward during Romberg, did not need assistance. Cerebellar: normal finger-to-nose with bilateral upper extremities. Normal heel-to -shin balance bilaterally of the lower extremity. No pronator drift.  Gait: Patient leaning leftward with ambulation. CV: distal pulses palpable throughout  Psychiatric:        Behavior: Behavior normal.     ED Results / Procedures / Treatments   Labs (all labs ordered are listed, but only abnormal results are displayed) Labs Reviewed  CBC - Abnormal; Notable for the following components:      Result Value   Hemoglobin 11.1 (*)    MCV 75.9 (*)    MCH 23.4 (*)    RDW 16.5 (*)    All other components within normal limits  COMPREHENSIVE METABOLIC PANEL - Abnormal; Notable for the following components:   Calcium 8.8 (*)    All other components within normal limits  RAPID URINE DRUG SCREEN, HOSP PERFORMED - Abnormal; Notable for the following components:   Tetrahydrocannabinol POSITIVE (*)     All other components within normal limits  URINALYSIS, ROUTINE W REFLEX MICROSCOPIC - Abnormal; Notable for the following components:   Hgb urine dipstick SMALL (*)    Nitrite POSITIVE (*)    Bacteria, UA MANY (*)    All other components within normal limits  I-STAT CHEM 8, ED - Abnormal; Notable for the following components:   Calcium, Ion 1.13 (*)    All other components within normal limits  URINE CULTURE  SARS CORONAVIRUS 2 (TAT 6-24 HRS)  PROTIME-INR  APTT  DIFFERENTIAL  CBG MONITORING, ED  I-STAT BETA HCG BLOOD, ED ([facility], [facility], [facility] ONLY)    EKG EKG Interpretation  Date/Time:  Thursday December 17 2018 18:24:07 EST Ventricular Rate:  72 PR Interval:  142 QRS Duration: 90 QT Interval:  406 QTC Calculation: 444 R Axis:  72 Text Interpretation: Normal sinus rhythm Minimal voltage criteria for LVH, may be normal variant ( Sokolow-Lyon ) ST & T wave abnormality, consider inferolateral ischemia Abnormal ECG No previous ECGs available Confirmed by Gerlene Fee [phone]) on 12/17/2018 11:01:21 PM   Radiology CT HEAD WO CONTRAST  Result Date: 12/17/2018 CLINICAL DATA:  Speech disturbance beginning 8 hours ago. Balance disturbance. EXAM: CT HEAD WITHOUT CONTRAST TECHNIQUE: Contiguous axial images were obtained from the base of the skull through the vertex without intravenous contrast. COMPARISON:  None. FINDINGS: Brain: The brainstem and cerebellum are normal. Low-density in the left basal ganglia and anterior limb internal capsule consistent with an old infarction. Old white matter infarction in the corona radiography on the left. No sign of acute infarction, mass lesion, hemorrhage, hydrocephalus or extra-axial collection. Vascular: No abnormal vascular finding. Skull: Normal Sinuses/Orbits: Clear/normal Other: None IMPRESSION: No acute finding by CT. 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 Old white matter infarction in the corona radiography on the left.  Electronically Signed   By: Nelson Chimes M.D.   On: 12/17/2018 20:22    Procedures .Critical Care Performed by: Deliah Boston, PA-C Authorized by: Deliah Boston, PA-C   Critical care provider statement:    Critical care time (minutes):  43   Critical care was necessary to treat or prevent imminent or life-threatening deterioration of the following conditions:  CNS failure or compromise   Critical care was time spent personally by me on the following activities:  Discussions with consultants, evaluation of patient's response to treatment, examination of patient, ordering and performing treatments and interventions, ordering and review of laboratory studies, ordering and review of radiographic studies, pulse oximetry, re-evaluation of patient's condition, obtaining history from patient or surrogate, review of old charts and development of treatment plan with patient or surrogate   (including critical care time)  Medications Ordered in ED Medications  sodium chloride flush (NS) 0.9 % injection 3 mL (has no administration in time range)  labetalol (NORMODYNE) injection 10 mg (0 mg Intravenous Hold 12/17/18 2308)  cefTRIAXone (ROCEPHIN) 1 g in sodium chloride 0.9 % 100 mL IVPB (1 g Intravenous New Bag/Given 12/18/18 0003)  clopidogrel (PLAVIX) tablet 300 mg (has no administration in time range)  clopidogrel (PLAVIX) tablet 75 mg (has no administration in time range)  acetaminophen (TYLENOL) tablet 650 mg (650 mg Oral Given 12/17/18 2314)  diphenhydrAMINE (BENADRYL) injection 12.5 mg (12.5 mg Intravenous Given 12/17/18 2339)  prochlorperazine (COMPAZINE) injection 10 mg (10 mg Intravenous Given 12/17/18 2338)    ED Course  I have reviewed the triage vital signs and the nursing notes.  Pertinent labs & imaging results that were available during my care of the patient were reviewed by me and considered in my medical decision making (see chart for details).  Clinical Course as of Dec 17 16  Thu Dec 17, 2018  2225 Dr. Leonel Ramsay.   [BM]  Fri Dec 18, 2018  0017 Dr. Roel Cluck   [BM]    Clinical Course User Index [BM] Gari Crown   MDM Rules/Calculators/A&P Patient with slurred speech and ataxia, leaning leftward beginning at noon today when she woke up from a nap. She denied any other symptoms at that time, the symptoms have been constant since onset.  No numbness, weakness, no facial droop. Out of window on initial evaluation.  LVO negative. She reports that she developed a mild headache since ED arrival but this was not present for the first  few hours of her symptoms.  Physical examination shows mild dysphasia as well as her reported leftward gait and Romberg, cranial nerves intact, neurovascularly intact to all 4 extremities. - I-STAT Chem-8 nonacute CBC nonacute APTT within normal limits PT/INR within normal limits CMP nonacute Urinalysis shows bacteria nitrates, will send for culture and treat CT Head:  IMPRESSION:  No acute finding by CT. Old left basal ganglia and anterior limb  internal capsule infarction. Old white matter infarction in the  corona radiography on the left.   EKG: Normal sinus rhythm Minimal voltage criteria for LVH, may be normal variant ( Sokolow-Lyon ) ST & T wave abnormality, consider inferolateral ischemia Abnormal ECG No previous ECGs available Confirmed by Kennis Carina [phone]) on 12/17/2018 11:01:21 PM - Discussed case with neurologist Dr. Amada Jupiter who has advised MRI brain, his team to see patient prior to disposition. - Patient hypertensive on monitor with systolic 233, I readjusted her blood pressure cuff and reposition the patient, RN reports patient was upset with a family member during previous blood pressure reading.  On repeat systolic 184.  10 mg labetalol has been ordered as needed.  Patient is requesting medication for her headache have ordered Tylenol, Compazine and Benadryl. - 11:35 PM: Rediscussed case in  person with Dr. Amada Jupiter advises patient likely suffered CVA today, admit to hospitalist and obtain MRI brain. - Patient reassessed resting comfortably no acute distress states understanding of care plan and is agreeable for admission. - Discussed case with Dr. Adela Glimpse who is seeing patient for admission.  Patient seen and evaluated by Dr. Pilar Plate during this visit.  Note: Portions of this report may have been transcribed using voice recognition software. Every effort was made to ensure accuracy; however, inadvertent computerized transcription errors may still be present. Final Clinical Impression(s) / ED Diagnoses Final diagnoses:  Cerebrovascular accident (CVA), unspecified mechanism [facility])    Rx / DC Orders ED Discharge Orders    None       Bill Salinas, PA-C 12/18/18 0017    Bill Salinas, PA-C 12/18/18 0018    Sabas Sous, MD 12/19/18 [phone]

## 2018-12-17 NOTE — ED Notes (Signed)
Pt went to restroom, urinated, but unable to collect in specimen cup for urine culture.

## 2018-12-17 NOTE — Consult Note (Signed)
Neurology Consultation Reason for Consult: Stroke Referring Physician: Pilar Plate, M  CC: Stroke  History is obtained from: Patient  HPI: Jamie [name] is a 43 y.o. female with a history of hypertension who presents with fall dysarthria and unsteady gait that she noticed at noon today after going to sleep at 9 AM.  She states that it is been a static deficit since it started not getting better and that getting worse.  She has not noticed any numbness or weakness.  She does not have any history of stroke, but does have a history of hypertension.  She does not take medications because she does not ike taking pills  LKW: 9 AM tpa given?: no, outside of window    ROS: A 14 point ROS was performed and is negative except as noted in the HPI.   Past Medical History:  Diagnosis Date  . Hypertension      Family History  Problem Relation Age of Onset  . Hypertension Mother   . Diabetes Mother      Social History:  reports that she has been smoking cigarettes. She has been smoking about 0.50 packs per day. She has never used smokeless tobacco. She reports current drug use. Drug: Marijuana. She reports that she does not drink alcohol.   Exam: Current vital signs: BP (!) 184/102   Pulse 66   Temp 98.2 F (36.8 C) (Oral)   Resp (!) 24   SpO2 100%  Vital signs in last 24 hours: Temp:  [98.1 F (36.7 C)-98.2 F (36.8 C)] 98.2 F (36.8 C) (12/10 2234) Pulse Rate:  [66-91] 66 (12/10 2305) Resp:  [16-24] 24 (12/10 2305) BP: (184-233)/(102-120) 184/102 (12/10 2302) SpO2:  [99 %-100 %] 100 % (12/10 2305)   Physical Exam  Constitutional: Appears well-developed and well-nourished.  Psych: Affect appropriate to situation Eyes: No scleral injection HENT: No OP obstrucion MSK: no joint deformities.  Cardiovascular: Normal rate and regular rhythm.  Respiratory: Effort normal, non-labored breathing GI: Soft.  No distension. There is no tenderness.  Skin: WDI  Neuro: Mental  Status: Patient is awake, alert, oriented to person, place, month, year, and situation. Patient is able to give a clear and coherent history. No signs of aphasia or neglect Cranial Nerves: II: Visual Fields are full. Pupils are equal, round, and reactive to light.   III,IV, VI: EOMI without ptosis or diploplia.  V: Facial sensation is symmetric to temperature VII: Facial movement with right facial weakness VIII: hearing is intact to voice X: Uvula elevates symmetrically XI: Shoulder shrug is symmetric. XII: tongue is midline without atrophy or fasciculations.  Motor: Tone is normal. Bulk is normal. 5/5 strength was present on the left.  She has a mild right arm drift with mild right arm and leg weakness 4+/5. Sensory: Sensation is symmetric to light touch and temperature in the arms and legs. Cerebellar: No clear ataxia   I have reviewed labs in epic and the results pertinent to this consultation are: CMP-unremarkable  I have reviewed the images obtained: Old left basal ganglia infarction  Impression: 43 year old female with new onset right-sided weakness and previous stroke on CT.  I suspect that she has had recurrent infarct.  My suspicion is this is a.  Tobacco abuse and hypertension.  She will need to be admitted for secondary risk factor modification and physical therapy.  Recommendations: - HgbA1c, fasting lipid panel - MRI, MRA  of the brain without contrast - Frequent neuro checks - Echocardiogram - Carotid dopplers -  Prophylactic therapy-Antiplatelet med: Aspirin -81 mg, Plavix 75 mg 100 mg load - Risk factor modification - Telemetry monitoring - PT consult, OT consult, Speech consult - Stroke team to follow  Roland Rack, MD [facility] [phone]  If 7pm- 7am, please page neurology on call as listed in [place].

## 2018-12-17 NOTE — ED Notes (Signed)
Jamie [name] [phone] sister is looking for a update on the patient

## 2018-12-18 ENCOUNTER — Inpatient Hospital Stay (HOSPITAL_COMMUNITY): Payer: Medicaid Other

## 2018-12-18 ENCOUNTER — Encounter (HOSPITAL_COMMUNITY): Payer: Self-pay | Admitting: Internal Medicine

## 2018-12-18 ENCOUNTER — Observation Stay (HOSPITAL_COMMUNITY): Payer: Medicaid Other

## 2018-12-18 ENCOUNTER — Other Ambulatory Visit (HOSPITAL_COMMUNITY): Payer: Medicaid Other

## 2018-12-18 ENCOUNTER — Encounter (HOSPITAL_COMMUNITY): Payer: Medicaid Other

## 2018-12-18 DIAGNOSIS — R471 Dysarthria and anarthria: Secondary | ICD-10-CM | POA: Diagnosis present

## 2018-12-18 DIAGNOSIS — Z720 Tobacco use: Secondary | ICD-10-CM | POA: Insufficient documentation

## 2018-12-18 DIAGNOSIS — R8290 Unspecified abnormal findings in urine: Secondary | ICD-10-CM | POA: Diagnosis present

## 2018-12-18 DIAGNOSIS — R29702 NIHSS score 2: Secondary | ICD-10-CM | POA: Diagnosis present

## 2018-12-18 DIAGNOSIS — I10 Essential (primary) hypertension: Secondary | ICD-10-CM | POA: Diagnosis present

## 2018-12-18 DIAGNOSIS — I6300 Cerebral infarction due to thrombosis of unspecified precerebral artery: Secondary | ICD-10-CM

## 2018-12-18 DIAGNOSIS — I6389 Other cerebral infarction: Secondary | ICD-10-CM | POA: Diagnosis not present

## 2018-12-18 DIAGNOSIS — R270 Ataxia, unspecified: Secondary | ICD-10-CM | POA: Diagnosis present

## 2018-12-18 DIAGNOSIS — R519 Headache, unspecified: Secondary | ICD-10-CM | POA: Diagnosis present

## 2018-12-18 DIAGNOSIS — Z20828 Contact with and (suspected) exposure to other viral communicable diseases: Secondary | ICD-10-CM | POA: Diagnosis present

## 2018-12-18 DIAGNOSIS — N390 Urinary tract infection, site not specified: Secondary | ICD-10-CM | POA: Diagnosis present

## 2018-12-18 DIAGNOSIS — I6381 Other cerebral infarction due to occlusion or stenosis of small artery: Secondary | ICD-10-CM | POA: Diagnosis not present

## 2018-12-18 DIAGNOSIS — Z8249 Family history of ischemic heart disease and other diseases of the circulatory system: Secondary | ICD-10-CM | POA: Diagnosis not present

## 2018-12-18 DIAGNOSIS — I639 Cerebral infarction, unspecified: Secondary | ICD-10-CM

## 2018-12-18 DIAGNOSIS — F17210 Nicotine dependence, cigarettes, uncomplicated: Secondary | ICD-10-CM | POA: Diagnosis present

## 2018-12-18 DIAGNOSIS — G459 Transient cerebral ischemic attack, unspecified: Secondary | ICD-10-CM | POA: Insufficient documentation

## 2018-12-18 DIAGNOSIS — Z833 Family history of diabetes mellitus: Secondary | ICD-10-CM | POA: Diagnosis not present

## 2018-12-18 DIAGNOSIS — Z9119 Patient's noncompliance with other medical treatment and regimen: Secondary | ICD-10-CM | POA: Diagnosis not present

## 2018-12-18 LAB — LIPID PANEL
Cholesterol: 212 mg/dL — ABNORMAL HIGH (ref 0–200)
Cholesterol: 208 mg/dL — ABNORMAL HIGH (ref 0–200)
HDL: 52 mg/dL (ref >40)
HDL: 48 mg/dL (ref >40)
LDL Cholesterol: 142 mg/dL — ABNORMAL HIGH (ref 0–99)
LDL Cholesterol: 147 mg/dL — ABNORMAL HIGH (ref 0–99)
Total CHOL/HDL Ratio: 4.1 RATIO
Total CHOL/HDL Ratio: 4.3 RATIO
Triglycerides: 89 mg/dL (ref <150)
Triglycerides: 65 mg/dL (ref <150)
VLDL: 18 mg/dL (ref 0–40)
VLDL: 13 mg/dL (ref 0–40)

## 2018-12-18 LAB — URINE CULTURE

## 2018-12-18 LAB — HEMOGLOBIN A1C
Hgb A1c MFr Bld: 5.6 % (ref 4.8–5.6)
Hgb A1c MFr Bld: 5.3 % (ref 4.8–5.6)
Mean Plasma Glucose: 114.02 mg/dL
Mean Plasma Glucose: 105.41 mg/dL

## 2018-12-18 LAB — ECHOCARDIOGRAM COMPLETE

## 2018-12-18 LAB — HIV ANTIBODY (ROUTINE TESTING W REFLEX): HIV Screen 4th Generation wRfx: NONREACTIVE

## 2018-12-18 LAB — SARS CORONAVIRUS 2 (TAT 6-24 HRS): SARS Coronavirus 2: NEGATIVE

## 2018-12-18 IMAGING — CR DG CHEST 2V
2 series · 2 of 2 positions shown · non-contrast
Comparison: [DATE]

CLINICAL DATA: Stroke

EXAM:
CHEST - 2 VIEW

[chest lat]
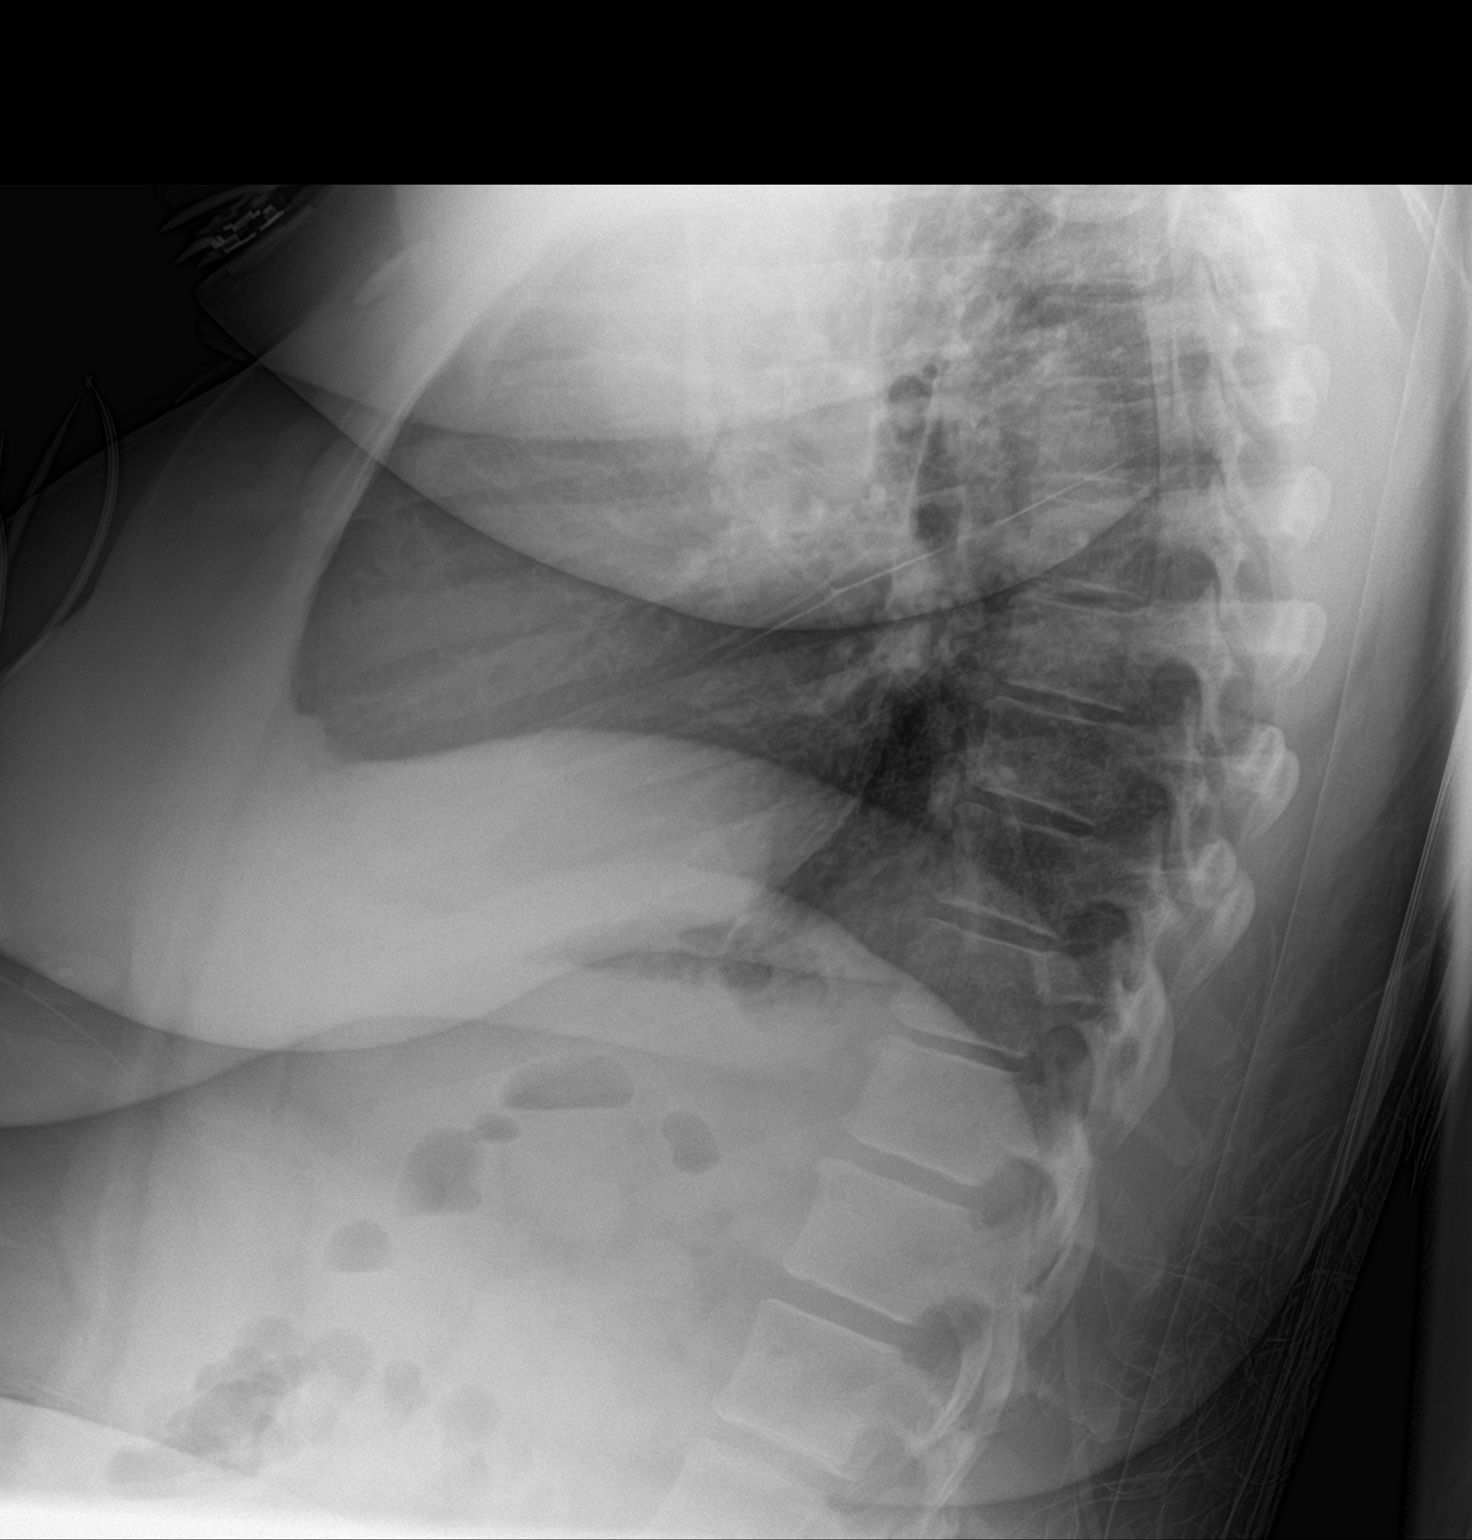

[chest ap]
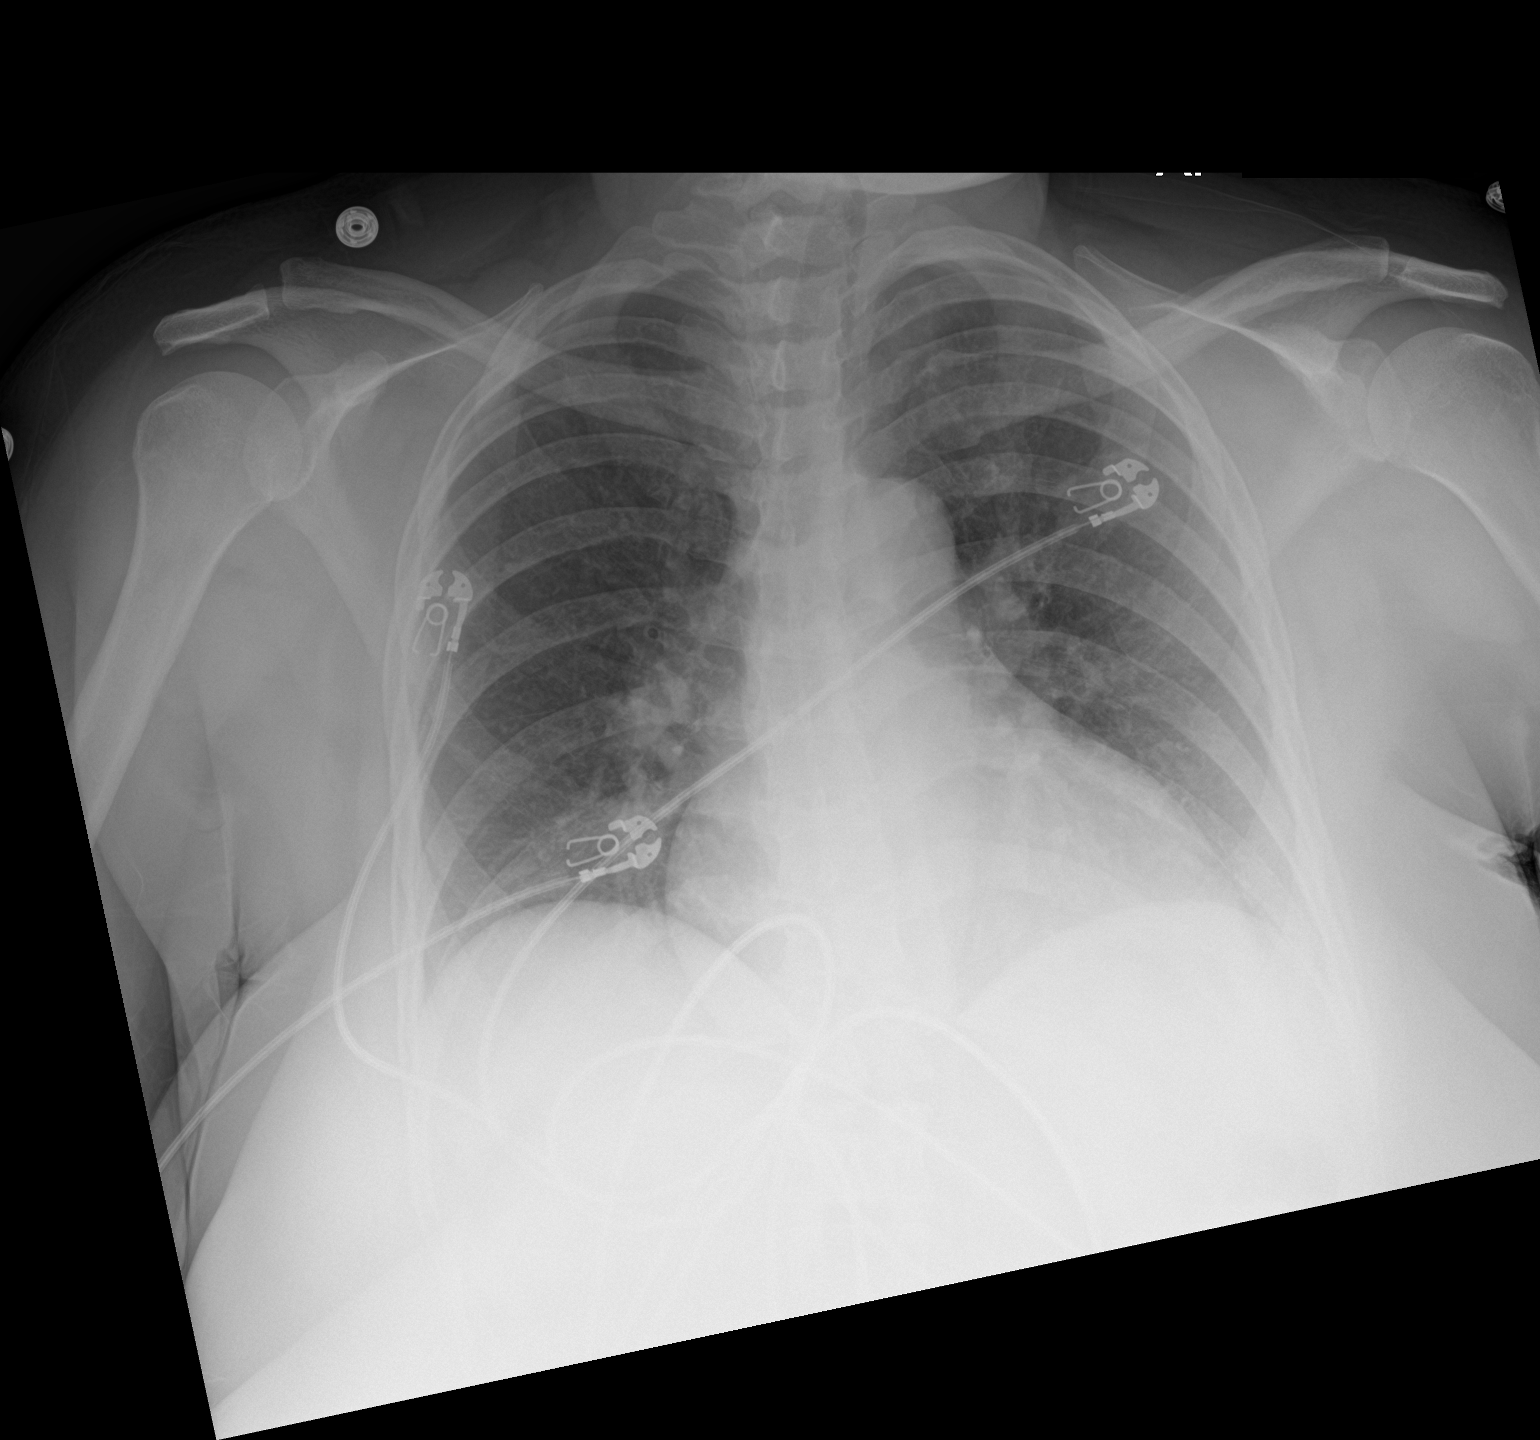

[2 of 2 positions shown; findings below may reference images not displayed]

FINDINGS: The heart size and mediastinal contours are within normal limits.
Both lungs are clear. The visualized skeletal structures are
unremarkable.
IMPRESSION: No active cardiopulmonary disease.

## 2018-12-18 IMAGING — MR MR HEAD W/O CM
12 of 14 series · 34 of 48 positions shown · non-contrast
Comparison: Head CT from yesterday

CLINICAL DATA: Focal neuro deficit with stroke suspected

EXAM:
MRI HEAD WITHOUT CONTRAST
MRA HEAD WITHOUT CONTRAST
TECHNIQUE: Multiplanar, multiecho pulse sequences of the brain and surrounding
structures were obtained without intravenous contrast. Angiographic
images of the head were obtained using MRA technique without
contrast.

[Series 5: DWI · axial · 3.0mm · 0.88mm/px · z∈[-113,+18]mm · 5 of 90 slices shown (1 of 4)]
[im 1/90]
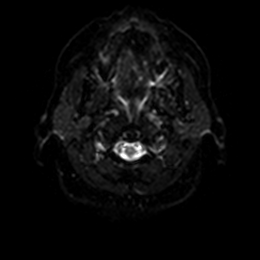
[im 23/90]
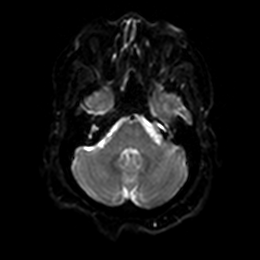
[im 45/90]
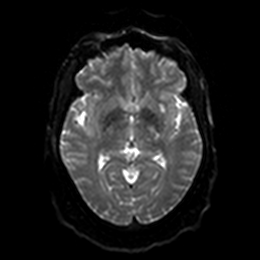
[im 67/90]
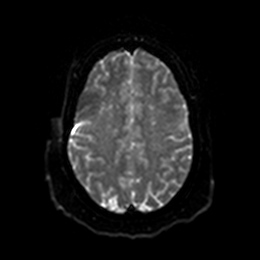
[im 90/90]
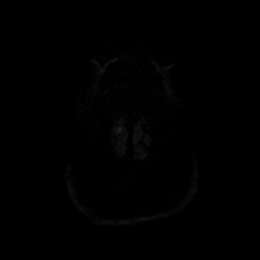

[Series 6: DWI · axial · 3.0mm · 0.88mm/px · z∈[-113,+18]mm · 3 of 45 slices shown (2 of 4)]
[im 1/45]
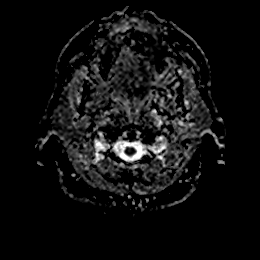
[im 23/45]
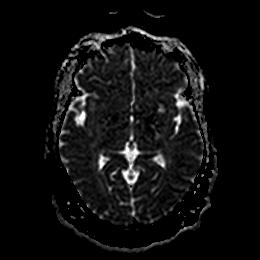
[im 45/45]
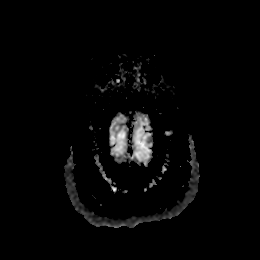

[Series 7: DWI · coronal · 4.0mm · 0.88mm/px · 4 of 70 slices shown (3 of 4)]
[im 1/70]
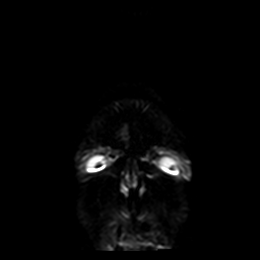
[im 24/70]
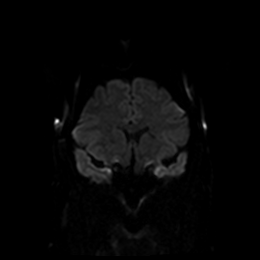
[im 47/70]
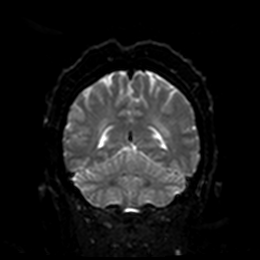
[im 70/70]
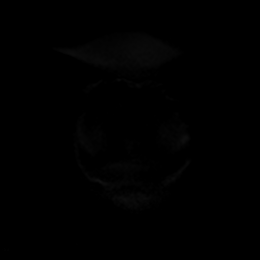

[Series 8: DWI · coronal · 4.0mm · 0.88mm/px · 2 of 35 slices shown (4 of 4)]
[im 1/35]
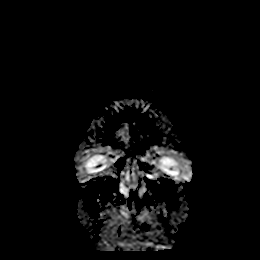
[im 35/35]
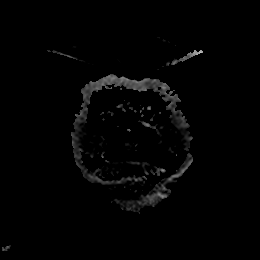

[Series 13: T1 · sagittal · 5.0mm · 0.75mm/px · 1 of 23 slices shown]
[im 1/23]
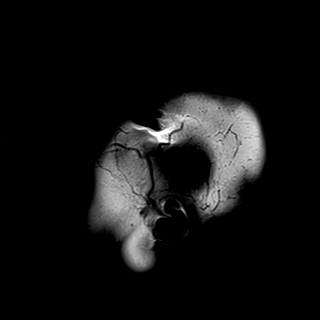

[Series 14: T2 · axial · 5.0mm · 0.72mm/px · 1 of 25 slices shown (1 of 2)]
[im 1/25]
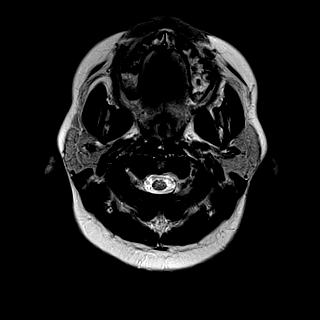

[Series 15: FLAIR · axial · 5.0mm · 0.45mm/px · 1 of 25 slices shown]
[im 1/25]
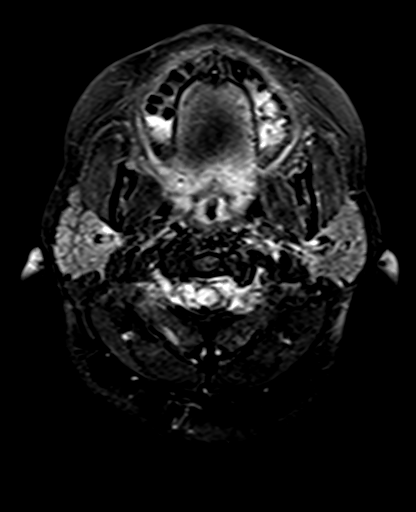

[Series 16: mag_images · axial · 3.0mm · 0.90mm/px · z∈[-124,+51]mm · 4 of 60 slices shown]
[im 1/60]
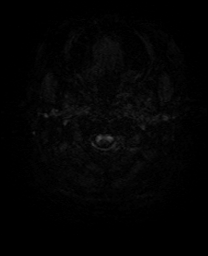
[im 20/60]
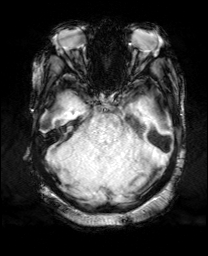
[im 40/60]
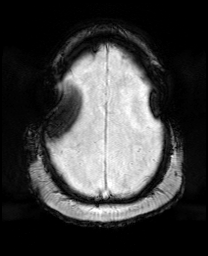
[im 60/60]
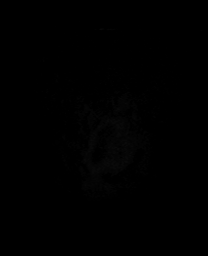

[Series 17: pha_images · axial · 3.0mm · 0.90mm/px · z∈[-124,+51]mm · 4 of 60 slices shown]
[im 1/60]
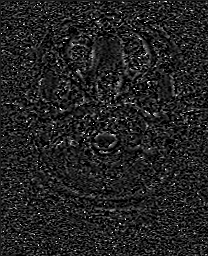
[im 20/60]
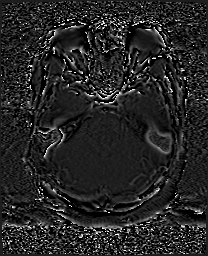
[im 40/60]
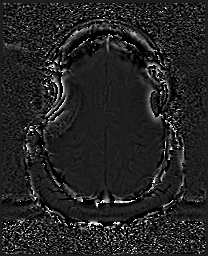
[im 60/60]
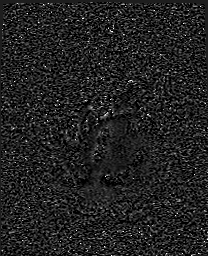

[Series 18: swi_images · axial · 3.0mm · 0.90mm/px · z∈[-124,+51]mm · 4 of 60 slices shown]
[im 1/60]
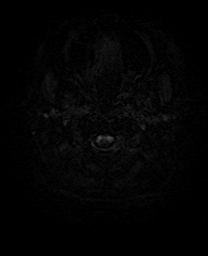
[im 20/60]
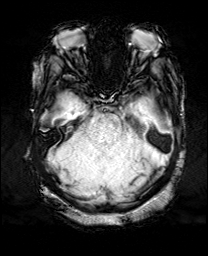
[im 40/60]
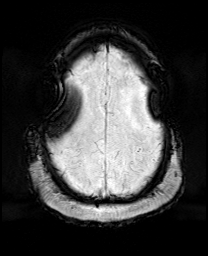
[im 60/60]
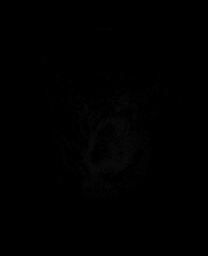

[Series 19: mip_images(sw) · axial · 24.0mm · 0.90mm/px · z∈[-113,+40]mm · 3 of 53 slices shown]
[im 1/53]
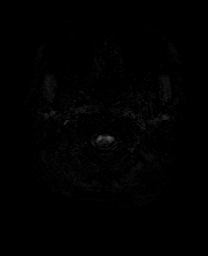
[im 27/53]
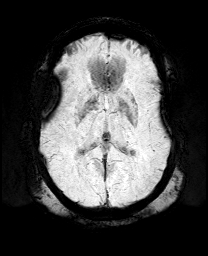
[im 53/53]
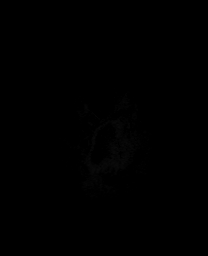

[Series 21: T2 · coronal · 5.0mm · 0.34mm/px · 2 of 29 slices shown (2 of 2)]
[im 1/29]
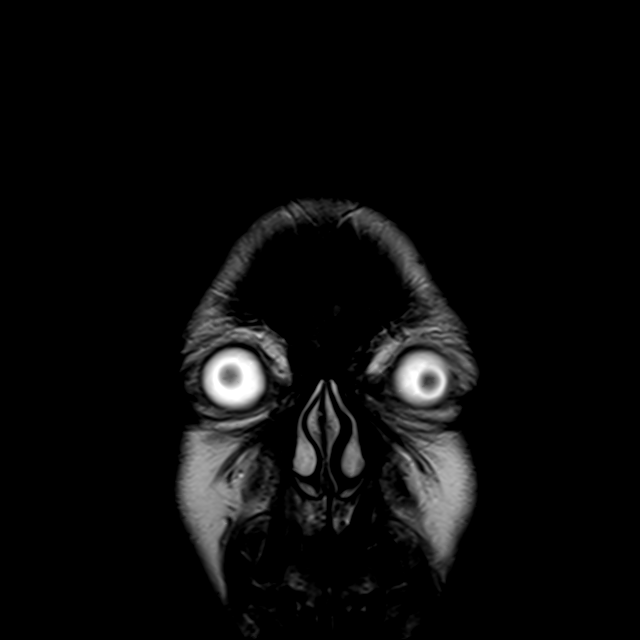
[im 29/29]
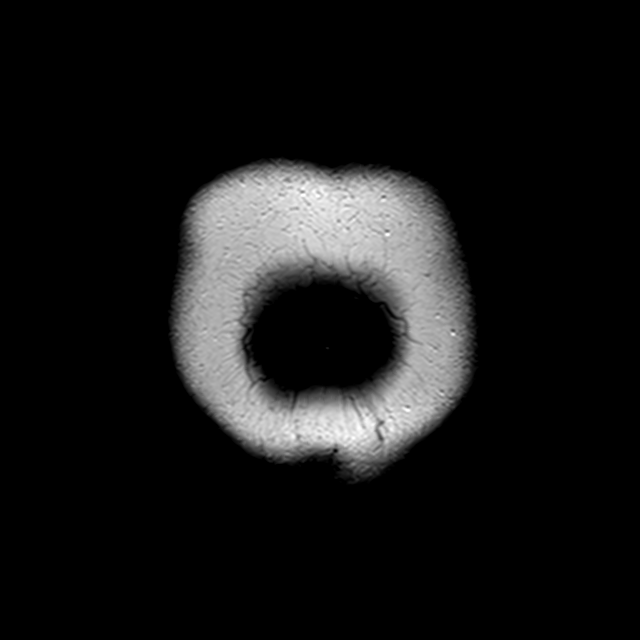

[34 of 48 positions shown; findings below may reference images not displayed]

FINDINGS: MRI HEAD FINDINGS

Brain: Subcentimeter area of restricted diffusion in the low
posterior limb left internal capsule and in the left posterior
cerebral white matter. There are other white matter signal
abnormalities. Given young age demyelination is a consideration but
the lacune in the left pons and the lesion in the left basal ganglia
have a typical infarct appearance, the latter along the lateral
lenticulostriate distribution. No acute hemorrhage, hydrocephalus,
mass, or collection. Brain volume is normal.

Vascular: Arterial findings below.  Normal marrow signal

Skull and upper cervical spine: Normal marrow signal

Sinuses/Orbits: Negative

MRA HEAD FINDINGS

Vertebral, basilar, and carotid arteries are widely patent. No
unusual circle-of-Willis variant. There is mild atheromatous type
irregularity of the basilar, proximal right PCA, and medium-sized
branches. No branch occlusion, proximal flow limiting stenosis,
aneurysm, or vascular malformation. The left M1 segment is notably
normal appearing (multiple left-sided basal ganglia infarcts are
present).
IMPRESSION: Brain MRI:

1. Acute lacunar infarcts in the posterior limb left internal
capsule and left posterior cerebral white matter.
2. Chronic small vessel ischemia with remote perforator/lacunar
infarcts in the left basal ganglia and pons.

Intracranial MRA:

1. No emergent finding
2. Mild but age unexpected atheromatous changes.

## 2018-12-18 MED ORDER — HYDROCHLOROTHIAZIDE 12.5 MG PO TABS: 12.5000 mg | ORAL_TABLET | Freq: Every day | ORAL | 1 refills | Status: DC

## 2018-12-18 MED ORDER — ATORVASTATIN CALCIUM 40 MG PO TABS: 40.0000 mg | ORAL_TABLET | Freq: Every day | ORAL | 1 refills | Status: DC

## 2018-12-18 MED ORDER — ASPIRIN 81 MG PO TBEC: 81.0000 mg | DELAYED_RELEASE_TABLET | Freq: Every day | ORAL | 0 refills | Status: DC

## 2018-12-18 MED ORDER — LISINOPRIL 20 MG PO TABS: 20.0000 mg | ORAL_TABLET | Freq: Every day | ORAL | 0 refills | Status: DC

## 2018-12-18 MED ORDER — STROKE: EARLY STAGES OF RECOVERY BOOK: Freq: Once | Status: DC

## 2018-12-18 MED ORDER — ACETAMINOPHEN 325 MG PO TABS: 650.0000 mg | ORAL_TABLET | ORAL | Status: DC | PRN

## 2018-12-18 MED ORDER — NICOTINE 21 MG/24HR TD PT24: 21.0000 mg | MEDICATED_PATCH | Freq: Every day | TRANSDERMAL | 0 refills | Status: DC

## 2018-12-18 MED ORDER — SODIUM CHLORIDE 0.9 % IV SOLN: INTRAVENOUS | Status: DC

## 2018-12-18 MED ORDER — CLOPIDOGREL BISULFATE 75 MG PO TABS: 75.0000 mg | ORAL_TABLET | Freq: Every day | ORAL | 0 refills | Status: DC

## 2018-12-18 MED ORDER — ACETAMINOPHEN 160 MG/5ML PO SOLN: 650.0000 mg | ORAL | Status: DC | PRN

## 2018-12-18 MED ORDER — ASPIRIN EC 81 MG PO TBEC: 81.0000 mg | DELAYED_RELEASE_TABLET | Freq: Every day | ORAL | Status: DC

## 2018-12-18 MED ORDER — NICOTINE 21 MG/24HR TD PT24
21.0000 mg | MEDICATED_PATCH | Freq: Every day | TRANSDERMAL | Status: DC
  Filled 2018-12-18: qty 1

## 2018-12-18 MED ORDER — CLOPIDOGREL BISULFATE 75 MG PO TABS: 75.0000 mg | ORAL_TABLET | Freq: Every day | ORAL | 0 refills | Status: AC

## 2018-12-18 MED ORDER — ATORVASTATIN CALCIUM 40 MG PO TABS: 40.0000 mg | ORAL_TABLET | Freq: Every day | ORAL | Status: DC

## 2018-12-18 MED ORDER — ACETAMINOPHEN 650 MG RE SUPP: 650.0000 mg | RECTAL | Status: DC | PRN

## 2018-12-18 MED FILL — CLOPIDOGREL 75 MG TABLET: 75 | 21 days supply | Qty: 21 | Fill #0

## 2018-12-18 MED FILL — HYDROCHLOROTHIAZIDE 12.5 MG: 12.5 | 30 days supply | Qty: 30 | Fill #0

## 2018-12-18 MED FILL — LISINOPRIL 20 MG TABLET: 20 | 30 days supply | Qty: 30 | Fill #0

## 2018-12-18 MED FILL — NICOTINE 21 MG/24HR PATCH: 21 | 28 days supply | Qty: 28 | Fill #0

## 2018-12-18 MED FILL — ATORVASTATIN CALCIUM 40 MG: 40 | 30 days supply | Qty: 30 | Fill #0

## 2018-12-18 NOTE — Discharge Summary (Addendum)
Physician Discharge Summary  Latanya MaudlinSequoyha S Rubert WUJ:[id number] DOB: Oct 12, 1975 DOA: 12/17/2018  PCP: System, Pcp Not In  Admit date: 12/17/2018 Discharge date: 12/18/2018  Time spent: 45 minutes  Recommendations for Outpatient Follow-up:  1. Follow up with PCP 2-3 weeks for evaluation of blood pressure control. 2. Follow up with neuro 6 weeks   Discharge Diagnoses:  Principal Problem:   Stroke [facility](HCC) Active Problems:   Essential hypertension   Abnormal finding on urinalysis   Discharge Condition: stable  Diet recommendation: heart healthy  There were no vitals filed for this visit.  History of present illness:  Presented with  patient went to take a nap at 9 AM 12/10 woke up at noon and noted that she had abnormal speech and trouble walking. felts off balance.  She was stumbling ~the left side.  She presented to emergency department that evening and  was able to ambulate while in emergency department. Prior to her nap she was in her normal state of health.  When she woke up and was answering the phone she noted that her speech was slurred. She came to ED   Hospital Course:  . CVA - hx htn, smoking and medical non-compliance. CT head revealed old left basal ganglia and anterior limb internal capsule infarction, old white matter infarction in the corona radiography on the left. MRI brain reveals acute lacunar infarcts in the posterior limb left internal capsule and left posterior cerebral white matter. Chronic small vessel ischemia with remote perforator/lacunar infarcts in the left basal ganglia and pons. MRA of head with no emergent findings. Carotid doplar within limits of normal. Echo with ef 60%, severely increased left ventricular hypertrophy. No increase in right ventricular wall thickness. ekg without acute changes. HgA1c 5.3. HIV non-reactive, lipid panel with cholesterol 208, LDL 147. Neuro recommended plavix and asa for 3 weeks and then asa only as well as lipitor.    Hypertension. Patient diagnosed "years ago" but no meds for quite awhile. Former home meds lisinopril and HCTZ. Allow permissive Hypertension keep BP <220/120 for 24 hours then resume home meds. Follow up with PCP 2-3 weeks for evaluation of BP control.  Tobacco abuse -  - Spoke about importance of quitting.  Abnormal UA - asymptomatic, received a dose of Rocephin in ER. Remained asymptomatic. urine culture pending  Procedures:  echo  Consultations:  neuro  Discharge Exam: Vitals:   12/18/18 0930 12/18/18 1100  BP:  (!) 179/107  Pulse: 71 82  Resp: 20 (!) 21  Temp:    SpO2: 97% 93%    General: awake alert no acute distress Cardiovascular: rrr no mgr no LE edema Respiratory: normal effort BS clear bilaterally Neuro: alert oriented x3. Speech slightly slurred. Tongue midline. Bilateral grip 5/5 le strength 5/5 bilaterally   Discharge Instructions    Allergies as of 12/18/2018   No Known Allergies     Medication List    STOP taking these medications   ibuprofen 600 MG tablet Commonly known as: ADVIL     TAKE these medications   aspirin 81 MG EC tablet Take 1 tablet (81 mg total) by mouth daily.   atorvastatin 40 MG tablet Commonly known as: LIPITOR Take 1 tablet (40 mg total) by mouth daily at 6 PM.   clopidogrel 75 MG tablet Commonly known as: PLAVIX Take 1 tablet (75 mg total) by mouth daily for 21 days. Start taking on: December 19, 2018   hydrochlorothiazide 12.5 MG tablet Commonly known as: HYDRODIURIL Take 1 tablet (12.5  mg total) by mouth daily.   lisinopril 20 MG tablet Commonly known as: ZESTRIL Take 1 tablet (20 mg total) by mouth daily.   nicotine 21 mg/24hr patch Commonly known as: NICODERM CQ - dosed in mg/24 hours Place 1 patch (21 mg total) onto the skin daily.      No Known Allergies    The results of significant diagnostics from this hospitalization (including imaging, microbiology, ancillary and laboratory) are listed  below for reference.    Significant Diagnostic Studies: DG Chest 2 View  Result Date: 12/18/2018 CLINICAL DATA:  Stroke EXAM: CHEST - 2 VIEW COMPARISON:  11/18/2017 FINDINGS: The heart size and mediastinal contours are within normal limits. Both lungs are clear. The visualized skeletal structures are unremarkable. IMPRESSION: No active cardiopulmonary disease. Electronically Signed   By: Deatra Robinson M.D.   On: 12/18/2018 00:55   CT HEAD WO CONTRAST  Result Date: 12/17/2018 CLINICAL DATA:  Speech disturbance beginning 8 hours ago. Balance disturbance. EXAM: CT HEAD WITHOUT CONTRAST TECHNIQUE: Contiguous axial images were obtained from the base of the skull through the vertex without intravenous contrast. COMPARISON:  None. FINDINGS: Brain: The brainstem and cerebellum are normal. Low-density in the left basal ganglia and anterior limb internal capsule consistent with an old infarction. Old white matter infarction in the corona radiography on the left. No sign of acute infarction, mass lesion, hemorrhage, hydrocephalus or extra-axial collection. Vascular: No abnormal vascular finding. Skull: Normal Sinuses/Orbits: Clear/normal Other: None IMPRESSION: No acute finding by CT. 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 Old white matter infarction in the corona radiography on the left. Electronically Signed   By: Paulina Fusi M.D.   On: 12/17/2018 20:22   MR ANGIO HEAD WO CONTRAST  Result Date: 12/18/2018 CLINICAL DATA:  Focal neuro deficit with stroke suspected EXAM: MRI HEAD WITHOUT CONTRAST MRA HEAD WITHOUT CONTRAST TECHNIQUE: Multiplanar, multiecho pulse sequences of the brain and surrounding structures were obtained without intravenous contrast. Angiographic images of the head were obtained using MRA technique without contrast. COMPARISON:  Head CT from yesterday FINDINGS: MRI HEAD FINDINGS Brain: Subcentimeter area of restricted diffusion in the low posterior limb left  internal capsule and in the left posterior cerebral white matter. There are other white matter signal abnormalities. Given young age demyelination is a consideration but the lacune in the left pons and the lesion in the left basal ganglia have a typical infarct appearance, the latter along the lateral lenticulostriate distribution. No acute hemorrhage, hydrocephalus, mass, or collection. Brain volume is normal. Vascular: Arterial findings below.  Normal marrow signal Skull and upper cervical spine: Normal marrow signal Sinuses/Orbits: Negative MRA HEAD FINDINGS Vertebral, basilar, and carotid arteries are widely patent. No unusual circle-of-Willis variant. There is mild atheromatous type irregularity of the basilar, proximal right PCA, and medium-sized branches. No branch occlusion, proximal flow limiting stenosis, aneurysm, or vascular malformation. The left M1 segment is notably normal appearing (multiple left-sided basal ganglia infarcts are present). IMPRESSION: Brain MRI: 1. Acute lacunar infarcts in the posterior limb left internal capsule and left posterior cerebral white matter. 2. Chronic small vessel ischemia with remote perforator/lacunar infarcts in the left basal ganglia and pons. Intracranial MRA: 1. No emergent finding 2. Mild but age unexpected atheromatous changes. Electronically Signed   By: Marnee Spring M.D.   On: 12/18/2018 05:25   MR BRAIN WO CONTRAST  Result Date: 12/18/2018 CLINICAL DATA:  Focal neuro deficit with stroke suspected EXAM: MRI HEAD WITHOUT CONTRAST MRA HEAD WITHOUT CONTRAST  TECHNIQUE: Multiplanar, multiecho pulse sequences of the brain and surrounding structures were obtained without intravenous contrast. Angiographic images of the head were obtained using MRA technique without contrast. COMPARISON:  Head CT from yesterday FINDINGS: MRI HEAD FINDINGS Brain: Subcentimeter area of restricted diffusion in the low posterior limb left internal capsule and in the left posterior  cerebral white matter. There are other white matter signal abnormalities. Given young age demyelination is a consideration but the lacune in the left pons and the lesion in the left basal ganglia have a typical infarct appearance, the latter along the lateral lenticulostriate distribution. No acute hemorrhage, hydrocephalus, mass, or collection. Brain volume is normal. Vascular: Arterial findings below.  Normal marrow signal Skull and upper cervical spine: Normal marrow signal Sinuses/Orbits: Negative MRA HEAD FINDINGS Vertebral, basilar, and carotid arteries are widely patent. No unusual circle-of-Willis variant. There is mild atheromatous type irregularity of the basilar, proximal right PCA, and medium-sized branches. No branch occlusion, proximal flow limiting stenosis, aneurysm, or vascular malformation. The left M1 segment is notably normal appearing (multiple left-sided basal ganglia infarcts are present). IMPRESSION: Brain MRI: 1. Acute lacunar infarcts in the posterior limb left internal capsule and left posterior cerebral white matter. 2. Chronic small vessel ischemia with remote perforator/lacunar infarcts in the left basal ganglia and pons. Intracranial MRA: 1. No emergent finding 2. Mild but age unexpected atheromatous changes. Electronically Signed   By: Marnee Spring M.D.   On: 12/18/2018 05:25   ECHOCARDIOGRAM COMPLETE  Result Date: 12/18/2018   ECHOCARDIOGRAM REPORT   Patient Name:   SYRINA WAKE Date of Exam: 12/18/2018 Medical Rec #:  [id number]       Height:       63.0 in Accession #:    [id number]      Weight:       215.0 lb Date of Birth:  [birth year]      BSA:          1.99 m Patient Age:    42 years        BP:           214/103 mmHg Patient Gender: F               HR:           68 bpm. Exam Location:  Inpatient Procedure: 2D Echo, Cardiac Doppler and Color Doppler Indications:    Stroke  History:        Patient has no prior history of Echocardiogram examinations. TIA                  and Stroke; Risk Factors:Hypertension.  Sonographer:    Sheralyn Boatman RDCS Referring Phys: [phone] Endi Lagman M Farren Nelles IMPRESSIONS  1. Left ventricular ejection fraction, by visual estimation, is 60 to 65%. The left ventricle has normal function. There is severely increased left ventricular hypertrophy.  2. Left ventricular diastolic parameters are indeterminate.  3. The left ventricle has no regional wall motion abnormalities.  4. Global right ventricle has normal systolic function.The right ventricular size is normal. No increase in right ventricular wall thickness.  5. Left atrial size was normal.  6. Right atrial size was normal.  7. The mitral valve is normal in structure. Trivial mitral valve regurgitation.  8. The tricuspid valve is normal in structure. Tricuspid valve regurgitation is trivial.  9. The aortic valve is tricuspid. Aortic valve regurgitation is not visualized. 10. The pulmonic valve was normal in structure. Pulmonic valve regurgitation is not visualized. 11.  The atrial septum is grossly normal. FINDINGS  Left Ventricle: Left ventricular ejection fraction, by visual estimation, is 60 to 65%. The left ventricle has normal function. The left ventricle has no regional wall motion abnormalities. There is severely increased left ventricular hypertrophy. Concentric left ventricular hypertrophy. Left ventricular diastolic parameters are indeterminate. Right Ventricle: The right ventricular size is normal. No increase in right ventricular wall thickness. Global RV systolic function is has normal systolic function. Left Atrium: Left atrial size was normal in size. Right Atrium: Right atrial size was normal in size Pericardium: There is no evidence of pericardial effusion. Mitral Valve: The mitral valve is normal in structure. Trivial mitral valve regurgitation. Tricuspid Valve: The tricuspid valve is normal in structure. Tricuspid valve regurgitation is trivial. Aortic Valve: The aortic valve is tricuspid. Aortic  valve regurgitation is not visualized. Pulmonic Valve: The pulmonic valve was normal in structure. Pulmonic valve regurgitation is not visualized. Pulmonic regurgitation is not visualized. Aorta: The aortic root and ascending aorta are structurally normal, with no evidence of dilitation. IAS/Shunts: The atrial septum is grossly normal.  LEFT VENTRICLE PLAX 2D LVIDd:         4.50 cm       Diastology LVIDs:         2.90 cm       LV e' lateral:   5.44 cm/s LV PW:         1.70 cm       LV E/e' lateral: 16.7 LV IVS:        1.70 cm       LV e' medial:    6.74 cm/s LVOT diam:     1.90 cm       LV E/e' medial:  13.5 LV SV:         60 ml LV SV Index:   28.28 LVOT Area:     2.84 cm  LV Volumes (MOD) LV area d, A2C:    28.80 cm LV area d, A4C:    30.70 cm LV area s, A2C:    17.00 cm LV area s, A4C:    16.40 cm LV major d, A2C:   8.00 cm LV major d, A4C:   7.77 cm LV major s, A2C:   6.72 cm LV major s, A4C:   6.15 cm LV vol d, MOD A2C: 87.1 ml LV vol d, MOD A4C: 97.9 ml LV vol s, MOD A2C: 37.3 ml LV vol s, MOD A4C: 34.3 ml LV SV MOD A2C:     49.8 ml LV SV MOD A4C:     97.9 ml LV SV MOD BP:      56.3 ml RIGHT VENTRICLE             IVC RV S prime:     13.50 cm/s  IVC diam: 1.50 cm TAPSE (M-mode): 2.3 cm LEFT ATRIUM             Index       RIGHT ATRIUM           Index LA diam:        4.20 cm 2.11 cm/m  RA Area:     15.40 cm LA Vol (A2C):   58.1 ml 29.14 ml/m RA Volume:   41.10 ml  20.61 ml/m LA Vol (A4C):   69.5 ml 34.85 ml/m LA Biplane Vol: 66.6 ml 33.40 ml/m  AORTIC VALVE LVOT Vmax:   137.00 cm/s LVOT Vmean:  83.700 cm/s LVOT VTI:    0.244 m  AORTA Ao Root diam: 3.00 cm Ao Asc diam:  3.30 cm MITRAL VALVE MV Area (PHT): 4.49 cm             SHUNTS MV PHT:        49.01 msec           Systemic VTI:  0.24 m MV Decel Time: 169 msec             Systemic Diam: 1.90 cm MV E velocity: 90.80 cm/s 103 cm/s MV A velocity: 79.30 cm/s 70.3 cm/s MV E/A ratio:  1.15       1.5  Mertie Moores MD Electronically signed by Mertie Moores  MD Signature Date/Time: 12/18/2018/10:47:17 AM    Final    VAS US CAROTID (at [facility] and [facility] only)  Result Date: 12/18/2018 Carotid Arterial Duplex Study Performing Technologist: Velva Harman Sturdivant RDMS, RVT  Examination Guidelines: A complete evaluation includes B-mode imaging, spectral Doppler, color Doppler, and power Doppler as needed of all accessible portions of each vessel. Bilateral testing is considered an integral part of a complete examination. Limited examinations for reoccurring indications may be performed as noted.  Right Carotid Findings: +----------+--------+--------+--------+------------------+--------+           PSV cm/sEDV cm/sStenosisPlaque DescriptionComments +----------+--------+--------+--------+------------------+--------+ CCA Prox  77      15                                         +----------+--------+--------+--------+------------------+--------+ CCA Distal55      16                                         +----------+--------+--------+--------+------------------+--------+ ICA Prox  54      15                                         +----------+--------+--------+--------+------------------+--------+ ICA Distal55      20                                         +----------+--------+--------+--------+------------------+--------+ ECA       83      18                                         +----------+--------+--------+--------+------------------+--------+ +----------+--------+-------+----------------+-------------------+           PSV cm/sEDV cmsDescribe        Arm Pressure (mmHG) +----------+--------+-------+----------------+-------------------+ [id number]            Multiphasic, WNL                    +----------+--------+-------+----------------+-------------------+ +---------+--------+--+--------+--+---------+ VertebralPSV cm/s48EDV cm/s20Antegrade +---------+--------+--+--------+--+---------+  Left Carotid Findings:  +----------+--------+--------+--------+------------------+--------+           PSV cm/sEDV cm/sStenosisPlaque DescriptionComments +----------+--------+--------+--------+------------------+--------+ CCA Prox  74      13                                         +----------+--------+--------+--------+------------------+--------+  ICA Prox  42      15                                         +----------+--------+--------+--------+------------------+--------+ ICA Distal129     33                                         +----------+--------+--------+--------+------------------+--------+ ECA       93      18                                         +----------+--------+--------+--------+------------------+--------+ +----------+--------+--------+----------------+-------------------+           PSV cm/sEDV cm/sDescribe        Arm Pressure (mmHG) +----------+--------+--------+----------------+-------------------+ [id number]              Multiphasic, WNL                    +----------+--------+--------+----------------+-------------------+ +---------+--------+--+--------+--+---------+ VertebralPSV cm/s55EDV cm/s21Antegrade +---------+--------+--+--------+--+---------+  Summary: Right Carotid: The extracranial vessels were near-normal with only minimal wall                thickening or plaque. Left Carotid: The extracranial vessels were near-normal with only minimal wall               thickening or plaque. Vertebrals: Bilateral vertebral arteries demonstrate antegrade flow. *See table(s) above for measurements and observations.     Preliminary     Microbiology: Recent Results (from the past 240 hour(s))  SARS CORONAVIRUS 2 (TAT 6-24 HRS) Nasopharyngeal Nasopharyngeal Swab     Status: None   Collection Time: 12/17/18 11:48 PM   Specimen: Nasopharyngeal Swab  Result Value Ref Range Status   SARS Coronavirus 2 NEGATIVE NEGATIVE Final    Comment: (NOTE) SARS-CoV-2 target  nucleic acids are NOT DETECTED. The SARS-CoV-2 RNA is generally detectable in upper and lower respiratory specimens during the acute phase of infection. Negative results do not preclude SARS-CoV-2 infection, do not rule out co-infections with other pathogens, and should not be used as the sole basis for treatment or other patient management decisions. Negative results must be combined with clinical observations, patient history, and epidemiological information. The expected result is Negative. Fact Sheet for Patients: HairSlick.no Fact Sheet for Healthcare Providers: quierodirigir.com This test is not yet approved or cleared by the [place] FDA and  has been authorized for detection and/or diagnosis of SARS-CoV-2 by FDA under an Emergency Use Authorization (EUA). This EUA will remain  in effect (meaning this test can be used) for the duration of the COVID-19 declaration under Section 56 4(b)(1) of the Act, 21 U.S.C. section 360bbb-3(b)(1), unless the authorization is terminated or revoked sooner. Performed at [facility], [address] [place], [place] [address]      Labs: Basic Metabolic Panel: Recent Labs  Lab 12/17/18 1824 12/17/18 1848  NA 138 140  K 3.7 3.8  CL 105 106  CO2 24  --   GLUCOSE 89 87  BUN 7 8  CREATININE 0.86 0.90  CALCIUM 8.8*  --    Liver Function Tests: Recent Labs  Lab 12/17/18 1824  AST 17  ALT 13  ALKPHOS 80  BILITOT 0.3  PROT 7.6  ALBUMIN 3.5   No results for input(s): LIPASE, AMYLASE in the last 168 hours. No results for input(s): AMMONIA in the last 168 hours. CBC: Recent Labs  Lab 12/17/18 1824 12/17/18 1848  WBC 9.3  --   NEUTROABS 5.5  --   HGB 11.1* 12.6  HCT 36.0 37.0  MCV 75.9*  --   PLT 317  --    Cardiac Enzymes: No results for input(s): CKTOTAL, CKMB, CKMBINDEX, TROPONINI in the last 168 hours. BNP: BNP (last 3 results) No results for input(s):  BNP in the last 8760 hours.  ProBNP (last 3 results) No results for input(s): PROBNP in the last 8760 hours.  CBG: No results for input(s): GLUCAP in the last 168 hours.     Signed:  Gwenyth Bender NP.  [facility] 12/18/2018, 3:07 PM

## 2018-12-18 NOTE — H&P (Signed)
Jamie [name] ZOX:[id number] DOB: 1975-07-20 DOA: 12/17/2018     PCP: System, Pcp Not In   Outpatient Specialists:  NONE    Patient arrived to ER on 12/17/18 at 1808  Patient coming from: home Lives  With family    Chief Complaint: Slurred speech and difficulty walking.   HPI: Jamie [name] is a 43 y.o. female with medical history significant of hypertension    Presented with   patient went to take a nap at 9 AM this morning woke up at noon and noted that she had abnormal speech and trouble walking feels off balance.  She was stumbling ~the left side.  She presented to emergency department was able to ambulate while in emergency department. Prior to her nap she was in her normal state of health.  When she woke up and was answering the phone she noted that her speech was slurred.  She never had this happen to her previously.   Infectious risk factors:  Reports none    In  ER RAPID COVID TEST   in house  PCR testing  Pending  Lab Results  Component Value Date   SARSCOV2NAA Not Detected 09/02/2018     Regarding pertinent Chronic problems:       HTN on hydrochlorothiazide lisinopril  States she has not been taking her medications   obesity-   BMI Readings from Last 1 Encounters:  04/14/18 38.09 kg/m         While in ER: On arrival to emergency department patient developed a headache no fevers or chills no recent illness no nausea no vomiting no constipation diarrhea  Noted to be initially hypertensive with a blood pressure up to 184/102 On physical exam patient was leaning to the left and have a leftward Romberg sign otherwise appear to be intact.  UA showed significant bacteria positive nitrates she was given a dose of Rocephin Urine culture ordered  Given a dose of labetalol for her elevated blood pressure.  CT head showed old left basal ganglia anteriorly infarction   ER Provider Called:   Neurology Dr. Amada Jupiter They Recommend admit to  medicine  Obtain MRI Will see in AM     The following Work up has been ordered so far:  Orders Placed This Encounter  Procedures  . Critical Care  . Urine culture  . SARS CORONAVIRUS 2 (TAT 6-24 HRS) Nasopharyngeal Nasopharyngeal Swab  . CT HEAD WO CONTRAST  . MR BRAIN WO CONTRAST  . Protime-INR  . APTT  . CBC  . Differential  . Comprehensive metabolic panel  . Rapid urine drug screen (hospital performed)  . Urinalysis, Routine w reflex microscopic  . Diet NPO time specified  . Cardiac monitoring  . Stroke swallow screen  . NIH Stroke Scale  . Modified Stroke Scale (mNIHSS) Document mNIHSS assessment every 2 hours for a total of 12 hours  . Saline Lock IV, Maintain IV access  . If O2 Sat <94% administer O2 at 2 liters/minute via nasal cannula  . Re-check Vital Signs  . Re-check Vital Signs  . Consult to neurology  ALL PATIENTS BEING ADMITTED/HAVING PROCEDURES NEED COVID-19 SCREENING  . Consult to hospitalist  ALL PATIENTS BEING ADMITTED/HAVING PROCEDURES NEED COVID-19 SCREENING  . Pulse oximetry, continuous  . I-stat chem 8, ED  . CBG monitoring, ED  . I-Stat beta hCG blood, ED  . ED EKG     Following Medications were ordered in ER: Medications  sodium chloride flush (NS) 0.9 %  injection 3 mL (has no administration in time range)  labetalol (NORMODYNE) injection 10 mg (0 mg Intravenous Hold 12/17/18 2308)  cefTRIAXone (ROCEPHIN) 1 g in sodium chloride 0.9 % 100 mL IVPB (1 g Intravenous New Bag/Given 12/18/18 0003)  clopidogrel (PLAVIX) tablet 300 mg (has no administration in time range)  clopidogrel (PLAVIX) tablet 75 mg (has no administration in time range)  acetaminophen (TYLENOL) tablet 650 mg (650 mg Oral Given 12/17/18 2314)  diphenhydrAMINE (BENADRYL) injection 12.5 mg (12.5 mg Intravenous Given 12/17/18 2339)  prochlorperazine (COMPAZINE) injection 10 mg (10 mg Intravenous Given 12/17/18 2338)        Consult Orders  (From admission, onward)          Start     Ordered   12/17/18 2338  Consult to hospitalist  ALL PATIENTS BEING ADMITTED/HAVING PROCEDURES NEED COVID-19 SCREENING Paged by Lewie Loron  Once    Comments: ALL PATIENTS BEING ADMITTED/HAVING PROCEDURES NEED COVID-19 SCREENING  Provider:  (Not yet assigned)  Question Answer Comment  Place call to: [facility]   Reason for Consult Admit      12/17/18 2337           Significant initial  Findings: Abnormal Labs Reviewed  CBC - Abnormal; Notable for the following components:      Result Value   Hemoglobin 11.1 (*)    MCV 75.9 (*)    MCH 23.4 (*)    RDW 16.5 (*)    All other components within normal limits  COMPREHENSIVE METABOLIC PANEL - Abnormal; Notable for the following components:   Calcium 8.8 (*)    All other components within normal limits  RAPID URINE DRUG SCREEN, HOSP PERFORMED - Abnormal; Notable for the following components:   Tetrahydrocannabinol POSITIVE (*)    All other components within normal limits  URINALYSIS, ROUTINE W REFLEX MICROSCOPIC - Abnormal; Notable for the following components:   Hgb urine dipstick SMALL (*)    Nitrite POSITIVE (*)    Bacteria, UA MANY (*)    All other components within normal limits  I-STAT CHEM 8, ED - Abnormal; Notable for the following components:   Calcium, Ion 1.13 (*)    All other components within normal limits     Otherwise labs showing:    Recent Labs  Lab 12/17/18 1824 12/17/18 1848  NA 138 140  K 3.7 3.8  CO2 24  --   GLUCOSE 89 87  BUN 7 8  CREATININE 0.86 0.90  CALCIUM 8.8*  --     Cr   stable,    Lab Results  Component Value Date   CREATININE 0.90 12/17/2018   CREATININE 0.86 12/17/2018   CREATININE 0.91 10/13/2017    Recent Labs  Lab 12/17/18 1824  AST 17  ALT 13  ALKPHOS 80  BILITOT 0.3  PROT 7.6  ALBUMIN 3.5   Lab Results  Component Value Date   CALCIUM 8.8 (L) 12/17/2018   WBC      Component Value Date/Time   WBC 9.3 12/17/2018 1824   ANC    Component Value  Date/Time   NEUTROABS 5.5 12/17/2018 1824   ALC No components found for: LYMPHAB    Plt: Lab Results  Component Value Date   PLT 317 12/17/2018     COVID-19 Labs  No results for input(s): DDIMER, FERRITIN, LDH, CRP in the last 72 hours.  Lab Results  Component Value Date   SARSCOV2NAA Not Detected 09/02/2018      HG/HCT stable,  Component Value Date/Time   HGB 12.6 12/17/2018 1848   HCT 37.0 12/17/2018 1848       ECG: Ordered Personally reviewed by me showing: HR : 72 Rhythm: NSR,   no evidence of ischemic changes QTC 444    UA elevated nitrites   Urine analysis:    Component Value Date/Time   COLORURINE YELLOW 12/17/2018 2236   APPEARANCEUR CLEAR 12/17/2018 2236   LABSPEC 1.016 12/17/2018 2236   PHURINE 6.0 12/17/2018 2236   GLUCOSEU NEGATIVE 12/17/2018 2236   HGBUR SMALL (A) 12/17/2018 2236   BILIRUBINUR NEGATIVE 12/17/2018 2236   KETONESUR NEGATIVE 12/17/2018 2236   PROTEINUR NEGATIVE 12/17/2018 2236   UROBILINOGEN 0.2 07/31/2007 2200   NITRITE POSITIVE (A) 12/17/2018 2236   LEUKOCYTESUR NEGATIVE 12/17/2018 2236       Ordered  CT HEAD evidence of old CVA  CXR - ordered  MRI ordered     ED Triage Vitals  Enc Vitals Group     BP 12/17/18 1817 (!) 192/120     Pulse Rate 12/17/18 1812 91     Resp 12/17/18 1812 18     Temp 12/17/18 1812 98.1 F (36.7 C)     Temp Source 12/17/18 1812 Oral     SpO2 12/17/18 1812 100 %     Weight --      Height --      Head Circumference --      Peak Flow --      Pain Score 12/17/18 1817 0     Pain Loc --      Pain Edu? --      Excl. in [place]? --   TMAX(24)@       Latest  Blood pressure (!) 184/102, pulse 66, temperature 98.2 F (36.8 C), temperature source Oral, resp. rate (!) 24, SpO2 100 %.   Hospitalist was called for admission for possible CVA   Review of Systems:    Pertinent positives include: localizing neurological complaints, slurred speech  Constitutional:  No weight loss, night  sweats, Fevers, chills, fatigue, weight loss  HEENT:  No headaches, Difficulty swallowing,Tooth/dental problems,Sore throat,  No sneezing, itching, ear ache, nasal congestion, post nasal drip,  Cardio-vascular:  No chest pain, Orthopnea, PND, anasarca, dizziness, palpitations.no Bilateral lower extremity swelling  GI:  No heartburn, indigestion, abdominal pain, nausea, vomiting, diarrhea, change in bowel habits, loss of appetite, melena, blood in stool, hematemesis Resp:  no shortness of breath at rest. No dyspnea on exertion, No excess mucus, no productive cough, No non-productive cough, No coughing up of blood.No change in color of mucus.No wheezing. Skin:  no rash or lesions. No jaundice GU:  no dysuria, change in color of urine, no urgency or frequency. No straining to urinate.  No flank pain.  Musculoskeletal:  No joint pain or no joint swelling. No decreased range of motion. No back pain.  Psych:  No change in mood or affect. No depression or anxiety. No memory loss.  Neuro: no  no tingling, no weakness, no double vision, no gait abnormality, no slurred speech, no confusion  All systems reviewed and apart from [place] all are negative  Past Medical History:   Past Medical History:  Diagnosis Date  . Hypertension       Past Surgical History:  Procedure Laterality Date  . WISDOM TOOTH EXTRACTION      Social History:  Ambulatory  Independently      reports that she has been smoking cigarettes. She has been smoking about 0.50 packs  per day. She has never used smokeless tobacco. She reports current drug use. Drug: Marijuana. She reports that she does not drink alcohol.     Family History:   Family History  Problem Relation Age of Onset  . Hypertension Mother   . Diabetes Mother     Allergies: No Known Allergies   Prior to Admission medications   Medication Sig Start Date End Date Taking? Authorizing Provider  hydrochlorothiazide (HYDRODIURIL) 12.5 MG tablet  Take 1 tablet (12.5 mg total) by mouth daily. Patient not taking: Reported on 12/17/2018 10/24/15   Barrett HenleNadeau, Nicole Elizabeth, PA-C  ibuprofen (ADVIL,MOTRIN) 600 MG tablet Take 1 tablet (600 mg total) by mouth every 6 (six) hours as needed. Patient not taking: Reported on 10/13/2017 10/24/15   Barrett HenleNadeau, Nicole Elizabeth, PA-C  lisinopril (PRINIVIL,ZESTRIL) 20 MG tablet Take 1 tablet (20 mg total) by mouth daily. Patient not taking: Reported on 12/17/2018 10/13/17   Bethann BerkshireZammit, Joseph, MD   Physical Exam: Blood pressure (!) 184/102, pulse 66, temperature 98.2 F (36.8 C), temperature source Oral, resp. rate (!) 24, SpO2 100 %. 1. General:  in No  Acute distress   Chronically ill  -appearing 2. Psychological: Alert and  Oriented 3. Head/ENT:    Dry Mucous Membranes                          Head Non traumatic, neck supple                           Poor Dentition 4. SKIN: decreased Skin turgor,  Skin clean Dry and intact no rash 5. Heart: Regular rate and rhythm no  Murmur, no Rub or gallop 6. Lungs:  no wheezes or crackles   7. Abdomen: Soft, non-tender, Non distended    Obese bowel sounds present 8. Lower extremities: no clubbing, cyanosis, no  edema 9. Neurologically strength diminished on the right cranial nerves II through XII intact 10. MSK: Normal range of motion   All other LABS:     Recent Labs  Lab 12/17/18 1824 12/17/18 1848  WBC 9.3  --   NEUTROABS 5.5  --   HGB 11.1* 12.6  HCT 36.0 37.0  MCV 75.9*  --   PLT 317  --      Recent Labs  Lab 12/17/18 1824 12/17/18 1848  NA 138 140  K 3.7 3.8  CL 105 106  CO2 24  --   GLUCOSE 89 87  BUN 7 8  CREATININE 0.86 0.90  CALCIUM 8.8*  --      Recent Labs  Lab 12/17/18 1824  AST 17  ALT 13  ALKPHOS 80  BILITOT 0.3  PROT 7.6  ALBUMIN 3.5       Cultures:    Component Value Date/Time   SDES URINE, RANDOM 02/18/2007 1300   SPECREQUEST Clean Catch OB 02/18/2007 1300   CULT  02/18/2007 1300    ESCHERICHIA  COLI Note: NO GROUP B STREP (S.AGALACTIAE) ISOLATED   REPTSTATUS 02/20/2007 FINAL 02/18/2007 1300     Radiological Exams on Admission: CT HEAD WO CONTRAST  Result Date: 12/17/2018 CLINICAL DATA:  Speech disturbance beginning 8 hours ago. Balance disturbance. EXAM: CT HEAD WITHOUT CONTRAST TECHNIQUE: Contiguous axial images were obtained from the base of the skull through the vertex without intravenous contrast. COMPARISON:  None. FINDINGS: Brain: The brainstem and cerebellum are normal. Low-density in the left basal ganglia and anterior limb internal capsule  consistent with an old infarction. Old white matter infarction in the corona radiography on the left. No sign of acute infarction, mass lesion, hemorrhage, hydrocephalus or extra-axial collection. Vascular: No abnormal vascular finding. Skull: Normal Sinuses/Orbits: Clear/normal Other: None IMPRESSION: No acute finding by CT. 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 Old white matter infarction in the corona radiography on the left. Electronically Signed   By: Paulina Fusi M.D.   On: 12/17/2018 20:22    Chart has been reviewed    Assessment/Plan  43 y.o. female with medical history significant of hypertension    Admitted for TIA vs CVA  Present on Admission: . TIA (transient ischemic attack)/CVA -  - will admit based on TIA/CVA protocol,         Monitor on Tele       MRA/MRI await  results         Echo to evaluate for possible embolic source,        obtain cardiac enzymes,  ECG,   Lipid panel, TSH.        Order PT/OT evaluation.        keep nothing by mouth until passes swallow eval  will need Speech pathology evaluation       Will make sure patient is on antiplatelet Plavix Asa 81 and statin if indicated        Allow permissive Hypertension keep BP <220/120        Neurology consulted    . Essential hypertension - allow permissive hypertension For tonight   Tobacco abuse -  - Spoke about  importance of quitting spent 5 minutes discussing options for treatment, prior attempts at quitting, and dangers of smoking  -At this point patient is     interested in quitting  - order nicotine patch   - nursing tobacco cessation protocol  Abnormal UA - asymptomatic, received a dose of Rocephin in ER will hold of for now, await results of urine culture  Other plan as per orders.  DVT prophylaxis:  SCD    Code Status:  FULL CODE as per patient   I had personally discussed CODE STATUS with patient    Family Communication:   Family not at  Bedside    Disposition Plan:      To home once workup is complete and patient is stable                    Would benefit from PT/OT eval prior to DC  Ordered                                     Consults called: Neurology is aware   Admission status:  ED Disposition    ED Disposition Condition Comment   Admit  Hospital Area: [facility] [[id number]]  Level of Care: Telemetry Medical [104]  I expect the patient will be discharged within 24 hours: No (not a candidate for 5C-Observation unit)  Covid Evaluation: Asymptomatic Screening Protocol (No Symptoms)  Diagnosis: TIA (transient ischemic attack) [517616]  Admitting Physician: Therisa Doyne [[id number]]  Attending Physician: Therisa Doyne [[id number]]  Bed request comments: neuro bed       Obs      Level of care    tele  For  24H     please discontinue once patient no longer qualifies   Precautions: asymptomatic screening protocol  No active  isolations    PPE: Used by the provider:   P100  eye Goggles,  Gloves    Jamie [name] 12/18/2018, 12:53 AM    [facility]     after 2 AM please page floor coverage PA If 7AM-7PM, please contact the day team taking care of the patient using [email or web address]

## 2018-12-18 NOTE — Progress Notes (Signed)
Patient admitted to OBS after midnight. Care began before midnight. 43 yo presented with slurred speech. MRI Acute lacunar infarcts in the posterior limb left internal capsule and left posterior cerebral white matter. Chronic small vessel ischemia with remote perforator/lacunar infarcts in the left basal ganglia and pons.  Continue with slurred speech.   Stroke work up started. Passed swallow eval.   Await stroke team recs.    Dyanne Carrel, NP

## 2018-12-18 NOTE — ED Notes (Signed)
Speech is clear , patient states she thinks she is sounding clearer. Husband at bedside.

## 2018-12-18 NOTE — ED Notes (Signed)
Pt ambulatory to restroom with steady gait.

## 2018-12-18 NOTE — ED Notes (Signed)
Pharmacy rep here to discuss drugs

## 2018-12-18 NOTE — Discharge Planning (Signed)
Pt PCP on Medicaid Card:  [facility]   Address: [address] [place], [place] [address]  Contact: [place] [facility]  Telephone: [phone]

## 2018-12-18 NOTE — Progress Notes (Signed)
STROKE TEAM PROGRESS NOTE   HISTORY OF PRESENT ILLNESS (per record) Jamie [name] is a 43 y.o. female with a history of hypertension who presents with fall dysarthria and unsteady gait that she noticed at noon today after going to sleep at 9 AM.  She states that it is been a static deficit since it started not getting better and that getting worse.  She has not noticed any numbness or weakness. She does not have any history of stroke, but does have a history of hypertension.  She does not take medications because she does not ike taking pills LKW: 9 AM tpa given?: no, outside of window   INTERVAL HISTORY I have personally reviewed history of presenting illness with the patient, electronic medical records and imaging films in PACS.  She presented with dysarthria and unsteady gait and MRI scan of the brain shows left basal ganglia lacunar infarct.  MRI of the brain is unremarkable.  Urine drug screen is positive for marijuana. She states her slurred speech has improved but not back to baseline.  Blood pressure is still elevated   OBJECTIVE Vitals:   12/18/18 0700 12/18/18 0806 12/18/18 0830 12/18/18 0930  BP: (!) 191/92  (!) 218/102   Pulse: 68 72 70 71  Resp: 19 20 17 20   Temp:      TempSrc:      SpO2: 93% 95% 100% 97%    CBC:  Recent Labs  Lab 12/17/18 1824 12/17/18 1848  WBC 9.3  --   NEUTROABS 5.5  --   HGB 11.1* 12.6  HCT 36.0 37.0  MCV 75.9*  --   PLT 317  --     Basic Metabolic Panel:  Recent Labs  Lab 12/17/18 1824 12/17/18 1848  NA 138 140  K 3.7 3.8  CL 105 106  CO2 24  --   GLUCOSE 89 87  BUN 7 8  CREATININE 0.86 0.90  CALCIUM 8.8*  --     Lipid Panel: No results found for: CHOL, TRIG, HDL, CHOLHDL, VLDL, LDLCALC HgbA1c:  Lab Results  Component Value Date   HGBA1C 5.6 12/17/2018   Urine Drug Screen:     Component Value Date/Time   LABOPIA NONE DETECTED 12/17/2018 2236   COCAINSCRNUR NONE DETECTED 12/17/2018 2236   LABBENZ NONE DETECTED  12/17/2018 2236   AMPHETMU NONE DETECTED 12/17/2018 2236   THCU POSITIVE (A) 12/17/2018 2236   LABBARB NONE DETECTED 12/17/2018 2236    Alcohol Level     Component Value Date/Time   [facility]  [facility]    <5        LOWEST DETECTABLE LIMIT FOR SERUM ALCOHOL IS 11 mg/dL FOR MEDICAL PURPOSES ONLY    IMAGING   DG Chest 2 View 12/18/2018 IMPRESSION:  No active cardiopulmonary disease.   CT HEAD WO CONTRAST 12/17/2018 IMPRESSION:  No acute finding by CT. 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 Old white matter infarction in the corona radiography on the left.   MR ANGIO HEAD WO CONTRAST 12/18/2018 IMPRESSION:   Brain MRI:  1. Acute lacunar infarcts in the posterior limb left internal capsule and left posterior cerebral white matter.  2. Chronic small vessel ischemia with remote perforator/lacunar infarcts in the left basal ganglia and pons.   Intracranial MRA:  1. No emergent finding  2. Mild but age unexpected atheromatous changes.    Transthoracic Echocardiogram  12/18/2018 IMPRESSIONS  1. Left ventricular ejection fraction, by visual estimation, is 60 to 65%. The  left ventricle has normal function. There is severely increased left ventricular hypertrophy.  2. Left ventricular diastolic parameters are indeterminate.  3. The left ventricle has no regional wall motion abnormalities.  4. Global right ventricle has normal systolic function.The right ventricular size is normal. No increase in right ventricular wall thickness.  5. Left atrial size was normal.  6. Right atrial size was normal.  7. The mitral valve is normal in structure. Trivial mitral valve regurgitation.  8. The tricuspid valve is normal in structure. Tricuspid valve regurgitation is trivial.  9. The aortic valve is tricuspid. Aortic valve regurgitation is not visualized. 10. The pulmonic valve was normal in structure. Pulmonic valve regurgitation is not visualized. 11. The  atrial septum is grossly normal.   Bilateral Carotid Dopplers  00/00/2020 pending   ECG - SR rate 72 BPM. (See cardiology reading for complete details)   PHYSICAL EXAM Blood pressure (!) 218/102, pulse 71, temperature 98.2 F (36.8 C), temperature source Oral, resp. rate 20, SpO2 97 %. Mildly obese middle-aged African-American lady not in distress. . Afebrile. Head is nontraumatic. Neck is supple without bruit.    Cardiac exam no murmur or gallop. Lungs are clear to auscultation. Distal pulses are well felt. Neurological Exam : She is awake alert oriented to time place and person.  Speech is clear with only mild dysarthria.  No aphasia or apraxia.  Extraocular movements are full range without nystagmus.  She blinks to threat bilaterally.  Fundi were not visualized.  There is mild right lower facial weakness.  Tongue midline motor system exam reveals mild right grip and hand muscle weakness as well as mild right hip flexor and ankle dorsiflexor weakness.  Normal strength on the left.  Reflexes are symmetric.  Touch pinprick sensation is preserved bilaterally.  Coordination is accurate.  Gait not tested.    ASSESSMENT/PLAN [name] is a 43 y.o. female with history of hypertension, previous strokes, and tobacco use  who presents with dysarthria, unsteady gait and a fall.      Stroke: Lacunar infarcts - small vessel disease  Resultant dysarthria and mild right face and hand weakness   code Stroke CT Head - not ordered  CT head - No acute finding by CT. 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 Old white matter infarction in the corona radiography on the left.   MRI head - Acute lacunar infarcts in the posterior limb left internal capsule and left posterior cerebral white matter. Chronic small vessel ischemia with remote perforator/lacunar infarcts in the left basal ganglia and pons  MRA head - No acute findings.  CTA H&N - not ordered  CT  Perfusion - not ordered  Carotid Doppler - pending  2D Echo - EF 60 - 65%. No cardiac source of emboli identified.   Sars Corona Virus 2 - negative  LDL - pending  HgbA1c - pending  UDS - THCU  VTE prophylaxis - none Diet  Diet Order            Diet Heart Room service appropriate? Yes; Fluid consistency: Thin  Diet effective now              No antithrombotic prior to admission, now on clopidogrel 75 mg daily and aspirin 81 mg daily for 3 weeks followed by aspirin alone  Patient counseled to be compliant with her antithrombotic medications  Ongoing aggressive stroke risk factor management  Therapy recommendations:  pending  Disposition:  Pending  Hypertension  Home BP meds: Lisinopril and HCTZ  Current BP meds: Labetalol prn  Systolic blood pressure somewhat high at times but within parameters . Permissive hypertension (OK if < 220/120) but gradually normalize in 5-7 days  . Long-term BP goal normotensive  Hyperlipidemia  Home Lipid lowering medication: none  LDL - pending, goal < 70  Current lipid lowering medication: none   Continue statin at discharge  Other Stroke Risk Factors  Cigarette smoker - advised to stop smoking  Obesity, There is no height or weight on file to calculate BMI., recommend weight loss, diet and exercise as appropriate   Substance Abuse - Marijuana  Previous strokes by imaging  Other Active Problems  UTI - IV Rocephin  Hospital day # 0 I have personally obtained history,examined this patient, reviewed notes, independently viewed imaging studies, participated in medical decision making and plan of care.ROS completed by me personally and pertinent positives fully documented  I have made any additions or clarifications directly to the above note.  She presented with dysarthria and right sided mild weakness secondary to left internal capsule lacunar infarct from small vessel disease.  Recommend aspirin Plavix for 3 weeks  followed by aspirin alone and aggressive risk factor modification.  Continue ongoing stroke work-up.  Patient advised to quit smoking cigarettes and marijuana and is agreeable.  Nicotine patch.  Long discussion with patient and answered questions.  Discussed with Dr. Broadus John.  Greater than 50% time during this 35-minute visit was spent in counseling and coordination of care about her lacunar stroke and discussion with care team. Antony Contras, MD Medical Director [place] Pager: [phone] 12/18/2018 4:41 PM   To contact Stroke Continuity provider, please refer to [email or web address] After hours, contact General Neurology

## 2018-12-18 NOTE — Evaluation (Addendum)
Physical Therapy Evaluation Patient Details Name: Jamie [name] MRN: [id number] DOB: [birth year] Today's Date: 12/18/2018   History of Present Illness  Patient is a 43 y/o female admitted with decreased balance and slurred speech after awaking from a nap.  MRI brain reveals acute lacunar infarcts in the posterior limb left internal capsule and left posterior cerebral white matter. Also had old left basal ganglia and anterior limb internal capsule infarction, old white matter infarction in the corona radiography on the left on head CT.  PMH HTN and tobacco abuse.  Clinical Impression  Patient presents with decreased balance/gait due to R sided weakness.  Currently S to mod I for mobility and spouse/sig other present and educated on technique on stairs and to watch for R sided veering.  Feel she will progress currently without follow up PT (per her request,) but discussed follow up with MD if not noticing improvements in mobility as well as speech.  Reviewed risk factor modification.  Patient stable for d/c home.  PT signing off.     Follow Up Recommendations No PT follow up    Equipment Recommendations  None recommended by PT    Recommendations for Other Services       Precautions / Restrictions Precautions Precaution Comments: watch BP      Mobility  Bed Mobility Overal bed mobility: Independent                Transfers Overall transfer level: Independent                  Ambulation/Gait Ambulation/Gait assistance: Supervision;Min guard Gait Distance (Feet): 400 Feet Assistive device: None Gait Pattern/deviations: Step-through pattern;Drifts right/left     General Gait Details: completed DGI, note twice hitting door facing on R side going out of her room and through fire doors in hallway; fatigued after stair negotiation so provided minguard walking back to her room  Stairs Stairs: Yes Stairs assistance: Supervision Stair Management: One rail  Right;Forwards;Alternating pattern;Step to pattern Number of Stairs: 8(& 3 x 2) General stair comments: step through initially and pt noted R LE buckling on descent so educated in step to pattern and pt practiced x 2  Wheelchair Mobility    Modified Rankin (Stroke Patients Only) Modified Rankin (Stroke Patients Only) Pre-Morbid Rankin Score: No symptoms Modified Rankin: Moderate disability     Balance Overall balance assessment: Needs assistance Sitting-balance support: Feet unsupported Sitting balance-Leahy Scale: Normal Sitting balance - Comments: sitting edge of stretcher reaching to don socks   Standing balance support: No upper extremity supported;During functional activity Standing balance-Leahy Scale: Good Standing balance comment: in bathroom able to turn to check her gown not stuck in bowl and no LOB                 Standardized Balance Assessment Standardized Balance Assessment : Dynamic Gait Index   Dynamic Gait Index Level Surface: Mild Impairment Change in Gait Speed: Normal Gait with Horizontal Head Turns: Mild Impairment Gait with Vertical Head Turns: Mild Impairment Gait and Pivot Turn: Normal Step Over Obstacle: Normal Step Around Obstacles: Mild Impairment Steps: Mild Impairment Total Score: 19       Pertinent Vitals/Pain Pain Assessment: No/denies pain    Home Living Family/patient expects to be discharged to:: Private residence Living Arrangements: Spouse/significant other Available Help at Discharge: Family Type of Home: Apartment Home Access: Stairs to enter     Home Layout: Two level Home Equipment: None;Grab bars - tub/shower      Prior  Function Level of Independence: Independent         Comments: 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, counselling        Extremity/Trunk Assessment   Upper Extremity Assessment Upper Extremity Assessment: RUE deficits/detail RUE Deficits / Details: AROM WFL, shoulder flexion  4/5, elbow flexion 4-/5, grip strength decreased compared to L RUE Sensation: WNL    Lower Extremity Assessment Lower Extremity Assessment: RLE deficits/detail RLE Deficits / Details: AROM WFL, strength 4/5 throughout RLE Sensation: WNL       Communication   Communication: Expressive difficulties(slurred speech)  Cognition Arousal/Alertness: Awake/alert Behavior During Therapy: WFL for tasks assessed/performed Overall Cognitive Status: Within Functional Limits for tasks assessed                                        General Comments General comments (skin integrity, edema, etc.): Educated on stroke risk factors and importance of modification; educated S.O. in room on assist on stairs and to watch for running into door facing on R; educated to ask PCP or stroke MD at follow up for outpatient SLP referral if notices speech not improved, same for PT referral if balance/R strength not better; educated in overpronouncing words and slowing speech to improve symptoms    Exercises     Assessment/Plan    PT Assessment Patent does not need any further PT services  PT Problem List         PT Treatment Interventions      PT Goals (Current goals can be found in the Care Plan section)  Acute Rehab PT Goals PT Goal Formulation: All assessment and education complete, DC therapy    Frequency     Barriers to discharge        Co-evaluation               AM-PAC PT "6 Clicks" Mobility  Outcome Measure Help needed turning from your back to your side while in a flat bed without using bedrails?: None Help needed moving from lying on your back to sitting on the side of a flat bed without using bedrails?: None Help needed moving to and from a bed to a chair (including a wheelchair)?: None Help needed standing up from a chair using your arms (e.g., wheelchair or bedside chair)?: None Help needed to walk in hospital room?: None Help needed climbing 3-5 steps with a  railing? : None 6 Click Score: 24    End of Session   Activity Tolerance: Patient tolerated treatment well Patient left: in bed;with call bell/phone within reach;with family/visitor present   PT Visit Diagnosis: Other abnormalities of gait and mobility (R26.89);Hemiplegia and hemiparesis Hemiplegia - Right/Left: Right Hemiplegia - dominant/non-dominant: Dominant Hemiplegia - caused by: Cerebral infarction    Time: 1610-9604 PT Time Calculation (min) (ACUTE ONLY): 28 min   Charges:   PT Evaluation $PT Eval Moderate Complexity: Jamie [name], Jamie [name] Acute Rehabilitation Services [phone] 12/18/2018   Jamie [name] 12/18/2018, 5:03 PM

## 2018-12-18 NOTE — ED Notes (Signed)
Lunch Tray Ordered @ 1107. 

## 2018-12-18 NOTE — ED Notes (Signed)
Please call the sister Kaylany Tesoriero at [phone]

## 2018-12-18 NOTE — ED Notes (Signed)
Heart healthy Tray Ordered.

## 2018-12-18 NOTE — Progress Notes (Signed)
  Echocardiogram 2D Echocardiogram has been performed.  Jamie [name] 12/18/2018, 8:42 AM

## 2018-12-18 NOTE — Progress Notes (Signed)
Carotid duplex  has been completed. Refer to [facility] under chart review to view preliminary results.   12/18/2018  1:55 PM Jaela Yepez, Bonnye Fava

## 2018-12-18 NOTE — ED Notes (Signed)
Spoke w/ MD Broadus John regarding pt's marked hypertension on DC. D/t CVA, MD Broadus John ok w/ permissive hypertension and plan to start oral antihypertensives tmrw. MD ok w/ proceeding w/ DC. Pt [facility] all medications from [facility] and all DC paperwork reviewed

## 2019-02-01 ENCOUNTER — Inpatient Hospital Stay: Payer: Medicaid Other | Admitting: Adult Health

## 2019-02-02 ENCOUNTER — Inpatient Hospital Stay: Payer: Medicaid Other | Admitting: Adult Health

## 2019-02-02 NOTE — Progress Notes (Deleted)
[facility] [address] [place]. [place] [address] [phone]       HOSPITAL FOLLOW UP NOTE  [name] Date of Birth:  01/12/1975 Medical Record Number:  [id number]   Reason for Referral:  hospital stroke follow up    CHIEF COMPLAINT:  No chief complaint on file.   HPI: Jamie [name] being seen today for in office hospital follow-up regarding lacunar infarct secondary to small vessel disease on 12/17/2018.  History obtained from *** and chart review. Reviewed all radiology images and labs personally.  [name] is a 44 y.o. female with history of hypertension, previous strokes, and tobacco use  who presented on 12/17/2018 with dysarthria, unsteady gait and a fall.   Evaluated by Dr. Pearlean Brownie and stroke team    with stroke work-up revealing acute lacunar infarcts in the left PL IC and left posterior cerebral white matter as evidenced on MRI likely secondary to small vessel disease.  MRI also showed evidence of chronic small vessel ischemia with remote perforator/lacunar infarcts in the left basal ganglia and pons.  MRA negative for acute findings.  Carotid Doppler unremarkable.  2D echo showed an EF of 60 to 65% without cardiac source of embolus identified.  COVID-19 negative.  UDS positive for THC.  Recommended DAPT for 3 weeks followed by aspirin alone as previously not on antithrombotic.  HTN stable and recommended long-term BP goal normotensive range.  LDL 142 and initiated atorvastatin 40mg  daily. No evidence or history of DM with A1c 5.6.  Other active problems include tobacco use with smoking cessation counseling provided, obesity, substance abuse with UDS positive for THC and prior strokes on imaging.  Other active problems include UTI treated with antibiotics.  Residual deficits of dysarthria and mild right facial and hand weakness      ROS:   14 system review of systems performed and negative with exception of ***  PMH:  Past Medical  History:  Diagnosis Date  . Hypertension   . Stroke (HCC)   . Tobacco use     PSH:  Past Surgical History:  Procedure Laterality Date  . WISDOM TOOTH EXTRACTION      Social History:  Social History   Socioeconomic History  . Marital status: Single    Spouse name: Not on file  . Number of children: Not on file  . Years of education: Not on file  . Highest education level: Not on file  Occupational History  . Not on file  Tobacco Use  . Smoking status: Current Every Day Smoker    Packs/day: 0.50    Types: Cigarettes  . Smokeless tobacco: Never Used  Substance and Sexual Activity  . Alcohol use: No  . Drug use: Yes    Types: Marijuana    Comment: occ  . Sexual activity: Not on file  Other Topics Concern  . Not on file  Social History Narrative  . Not on file   Social Determinants of Health   Financial Resource Strain:   . Difficulty of Paying Living Expenses: Not on file  Food Insecurity:   . Worried About in the Last Year: Not on file  . Ran Out of Food in the Last Year: Not on file  Transportation Needs:   . Lack of Transportation (Medical): Not on file  . Lack of Transportation (Non-Medical): Not on file  Physical Activity:   . Days of Exercise per Week: Not on file  . Minutes  of Exercise per Session: Not on file  Stress:   . Feeling of Stress : Not on file  Social Connections:   . Frequency of Communication with Friends and Family: Not on file  . Frequency of Social Gatherings with Friends and Family: Not on file  . Attends Religious Services: Not on file  . Active Member of Clubs or Organizations: Not on file  . Attends Archivist Meetings: Not on file  . Marital Status: Not on file  Intimate Partner Violence:   . Fear of Current or Ex-Partner: Not on file  . Emotionally Abused: Not on file  . Physically Abused: Not on file  . Sexually Abused: Not on file    Family History:  Family History  Problem Relation Age of  Onset  . Hypertension Mother   . Diabetes Mother     Medications:   Current Outpatient Medications on File Prior to Visit  Medication Sig Dispense Refill  . aspirin 81 MG EC tablet Take 1 tablet (81 mg total) by mouth daily.  0  . atorvastatin (LIPITOR) 40 MG tablet Take 1 tablet (40 mg total) by mouth daily at 6 PM. 30 tablet 1  . hydrochlorothiazide (HYDRODIURIL) 12.5 MG tablet Take 1 tablet (12.5 mg total) by mouth daily. 30 tablet 1  . lisinopril (ZESTRIL) 20 MG tablet Take 1 tablet (20 mg total) by mouth daily. 30 tablet 0  . nicotine (NICODERM CQ - DOSED IN MG/24 HOURS) 21 mg/24hr patch Place 1 patch (21 mg total) onto the skin daily. 28 patch 0   No current facility-administered medications on file prior to visit.    Allergies:  No Known Allergies   Physical Exam  There were no vitals filed for this visit. There is no height or weight on file to calculate BMI. No exam data present  No flowsheet data found.   General: well developed, well nourished, seated, in no evident distress Head: head normocephalic and atraumatic.   Neck: supple with no carotid or supraclavicular bruits Cardiovascular: regular rate and rhythm, no murmurs Musculoskeletal: no deformity Skin:  no rash/petichiae Vascular:  Normal pulses all extremities   Neurologic Exam Mental Status: Awake and fully alert. Oriented to place and time. Recent and remote memory intact. Attention span, concentration and fund of knowledge appropriate. Mood and affect appropriate.  Cranial Nerves: Fundoscopic exam reveals sharp disc margins. Pupils equal, briskly reactive to light. Extraocular movements full without nystagmus. Visual fields full to confrontation. Hearing intact. Facial sensation intact. Face, tongue, palate moves normally and symmetrically.  Motor: Normal bulk and tone. Normal strength in all tested extremity muscles. Sensory.: intact to touch , pinprick , position and vibratory sensation.  Coordination:  Rapid alternating movements normal in all extremities. Finger-to-nose and heel-to-shin performed accurately bilaterally. Gait and Station: Arises from chair without difficulty. Stance is normal. Gait demonstrates normal stride length and balance Reflexes: 1+ and symmetric. Toes downgoing.     NIHSS  *** Modified Rankin  *** CHA2DS2-VASc *** HAS-BLED ***   Diagnostic Data (Labs, Imaging, Testing)  DG Chest 2 View 12/18/2018 IMPRESSION:  No active cardiopulmonary disease.   CT HEAD WO CONTRAST 12/17/2018 IMPRESSION:  No acute finding by CT. 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 Old white matter infarction in the corona radiography on the left.   MR ANGIO HEAD WO CONTRAST 12/18/2018 IMPRESSION:   Brain MRI:  1. Acute lacunar infarcts in the posterior limb left internal capsule and left posterior cerebral white matter.  2. Chronic small vessel ischemia with remote perforator/lacunar infarcts in the left basal ganglia and pons.   Intracranial MRA:  1. No emergent finding  2. Mild but age unexpected atheromatous changes.    Transthoracic Echocardiogram  12/18/2018 IMPRESSIONS 1. Left ventricular ejection fraction, by visual estimation, is 60 to 65%. The left ventricle has normal function. There is severely increased left ventricular hypertrophy. 2. Left ventricular diastolic parameters are indeterminate. 3. The left ventricle has no regional wall motion abnormalities. 4. Global right ventricle has normal systolic function.The right ventricular size is normal. No increase in right ventricular wall thickness. 5. Left atrial size was normal. 6. Right atrial size was normal. 7. The mitral valve is normal in structure. Trivial mitral valve regurgitation. 8. The tricuspid valve is normal in structure. Tricuspid valve regurgitation is trivial. 9. The aortic valve is tricuspid. Aortic valve regurgitation is not visualized. 10. The  pulmonic valve was normal in structure. Pulmonic valve regurgitation is not visualized. 11. The atrial septum is grossly normal.   Bilateral Carotid Dopplers  12/18/2018 Summary: Right Carotid: The extracranial vessels were near-normal with only minimal wall                thickening or plaque. Left Carotid: The extracranial vessels were near-normal with only minimal wall               thickening or plaque. Vertebrals: Bilateral vertebral arteries demonstrate antegrade flow   ECG - SR rate 72 BPM. (See cardiology reading for complete details)    ASSESSMENT: Jamie [name] is a 44 y.o. year old female presented with dysarthria, unsteady gait and a fall on 12/17/2018 with stroke work-up revealing acute lacunar infarcts in the left PLIC and left posterior cerebral white matter secondary to small vessel disease. Vascular risk factors include HTN, prior strokes, tobacco use, THC use.     PLAN:  1. Lacunar infarcts: Continue aspirin 81 mg daily  and ***  for secondary stroke prevention. Maintain strict control of hypertension with blood pressure goal below 130/90, diabetes with hemoglobin A1c goal below 6.5% and cholesterol with LDL cholesterol (bad cholesterol) goal below 70 mg/dL.  I also advised the patient to eat a healthy diet with plenty of whole grains, cereals, fruits and vegetables, exercise regularly with at least 30 minutes of continuous activity daily and maintain ideal body weight. 2. HTN: Advised to continue current treatment regimen.  Today's BP ***.  Advised to continue to monitor at home along with continued follow-up with PCP for management 3. HLD: Advised to continue current treatment regimen along with continued follow-up with PCP for future prescribing and monitoring of lipid panel    Follow up in *** or call earlier if needed   Greater than 50% of time during this 45 minute visit was spent on counseling, explanation of diagnosis of ***, reviewing risk factor  management of ***, planning of further management along with potential future management, and discussion with patient and family answering all questions.    Ihor Austin, AGNP-BC  [facility] [address] [place], [place] [address]  Phone [phone] Fax [phone] Note: This document was prepared with digital dictation and possible smart phrase technology. Any transcriptional errors that result from this process are unintentional.

## 2019-02-08 ENCOUNTER — Encounter: Payer: Self-pay | Admitting: Adult Health

## 2019-08-04 ENCOUNTER — Ambulatory Visit: Payer: Medicaid Other | Attending: Internal Medicine

## 2019-08-04 DIAGNOSIS — Z20822 Contact with and (suspected) exposure to covid-19: Secondary | ICD-10-CM

## 2019-08-05 LAB — SARS-COV-2, NAA 2 DAY TAT

## 2019-08-05 LAB — NOVEL CORONAVIRUS, NAA: SARS-CoV-2, NAA: DETECTED — AB

## 2019-08-06 ENCOUNTER — Telehealth: Payer: Self-pay | Admitting: Physician Assistant

## 2019-08-06 NOTE — Telephone Encounter (Signed)
Called to discuss with Latanya Maudlin about Covid symptoms and the use of bamlanivimab/etesevimab or casirivimab/imdevimab, a monoclonal antibody infusion for those with mild to moderate Covid symptoms and at a high risk of hospitalization.     Pt is qualified for this infusion at the monoclonal antibody infusion center due to co-morbid conditions and/or a member of an at-risk group, however declines infusion at this time. Symptoms tier reviewed as well as criteria for ending isolation.  Symptoms reviewed that would warrant ED/Hospital evaluation. Preventative practices reviewed. Patient verbalized understanding. Patient advised to call back if he decides that he does want to get infusion. Callback number to the infusion center given. Patient advised to go to Urgent care or ED with severe symptoms. Last date pt would be eligible for infusion is 7/30.   Patient Active Problem List   Diagnosis Date Noted  . TIA (transient ischemic attack) 12/18/2018  . Essential hypertension 12/18/2018  . CVA (cerebral vascular accident) (HCC) 12/18/2018  . Abnormal finding on urinalysis 12/18/2018  . Tobacco use     Cline Crock PA-C

## 2019-08-20 ENCOUNTER — Other Ambulatory Visit: Payer: Medicaid Other

## 2019-08-20 ENCOUNTER — Other Ambulatory Visit: Payer: Self-pay | Admitting: Radiology

## 2019-08-20 DIAGNOSIS — Z20822 Contact with and (suspected) exposure to covid-19: Secondary | ICD-10-CM

## 2019-08-21 LAB — NOVEL CORONAVIRUS, NAA: SARS-CoV-2, NAA: NOT DETECTED

## 2019-08-21 LAB — SARS-COV-2, NAA 2 DAY TAT

## 2021-04-16 ENCOUNTER — Emergency Department (HOSPITAL_COMMUNITY): Payer: Medicaid Other

## 2021-04-16 ENCOUNTER — Emergency Department (HOSPITAL_BASED_OUTPATIENT_CLINIC_OR_DEPARTMENT_OTHER): Payer: Medicaid Other

## 2021-04-16 ENCOUNTER — Observation Stay (HOSPITAL_COMMUNITY)
Admission: EM | Admit: 2021-04-16 | Discharge: 2021-04-17 | Disposition: A | Discharge status: Home / Self Care | Payer: Medicaid Other | Attending: Internal Medicine | Admitting: Internal Medicine

## 2021-04-16 DIAGNOSIS — Z20822 Contact with and (suspected) exposure to covid-19: Secondary | ICD-10-CM | POA: Diagnosis not present

## 2021-04-16 DIAGNOSIS — I10 Essential (primary) hypertension: Secondary | ICD-10-CM | POA: Insufficient documentation

## 2021-04-16 DIAGNOSIS — I639 Cerebral infarction, unspecified: Principal | ICD-10-CM | POA: Insufficient documentation

## 2021-04-16 DIAGNOSIS — F17210 Nicotine dependence, cigarettes, uncomplicated: Secondary | ICD-10-CM | POA: Insufficient documentation

## 2021-04-16 DIAGNOSIS — Z7982 Long term (current) use of aspirin: Secondary | ICD-10-CM | POA: Insufficient documentation

## 2021-04-16 DIAGNOSIS — Z7902 Long term (current) use of antithrombotics/antiplatelets: Secondary | ICD-10-CM | POA: Insufficient documentation

## 2021-04-16 DIAGNOSIS — D509 Iron deficiency anemia, unspecified: Secondary | ICD-10-CM | POA: Insufficient documentation

## 2021-04-16 DIAGNOSIS — G459 Transient cerebral ischemic attack, unspecified: Secondary | ICD-10-CM | POA: Diagnosis present

## 2021-04-16 DIAGNOSIS — R350 Frequency of micturition: Secondary | ICD-10-CM

## 2021-04-16 DIAGNOSIS — R609 Edema, unspecified: Secondary | ICD-10-CM

## 2021-04-16 DIAGNOSIS — M7989 Other specified soft tissue disorders: Secondary | ICD-10-CM | POA: Diagnosis not present

## 2021-04-16 DIAGNOSIS — I160 Hypertensive urgency: Secondary | ICD-10-CM | POA: Insufficient documentation

## 2021-04-16 DIAGNOSIS — Z8673 Personal history of transient ischemic attack (TIA), and cerebral infarction without residual deficits: Secondary | ICD-10-CM

## 2021-04-16 DIAGNOSIS — N390 Urinary tract infection, site not specified: Secondary | ICD-10-CM | POA: Insufficient documentation

## 2021-04-16 DIAGNOSIS — R600 Localized edema: Secondary | ICD-10-CM | POA: Diagnosis not present

## 2021-04-16 DIAGNOSIS — Z79899 Other long term (current) drug therapy: Secondary | ICD-10-CM | POA: Diagnosis not present

## 2021-04-16 DIAGNOSIS — E785 Hyperlipidemia, unspecified: Secondary | ICD-10-CM | POA: Insufficient documentation

## 2021-04-16 DIAGNOSIS — Z720 Tobacco use: Secondary | ICD-10-CM | POA: Diagnosis present

## 2021-04-16 DIAGNOSIS — R531 Weakness: Secondary | ICD-10-CM | POA: Diagnosis present

## 2021-04-16 LAB — COMPREHENSIVE METABOLIC PANEL
ALT: 15 U/L (ref 0–44)
AST: 16 U/L (ref 15–41)
Albumin: 3.4 g/dL — ABNORMAL LOW (ref 3.5–5.0)
Alkaline Phosphatase: 79 U/L (ref 38–126)
Anion gap: 6 (ref 5–15)
BUN: 11 mg/dL (ref 6–20)
CO2: 26 mmol/L (ref 22–32)
Calcium: 8.6 mg/dL — ABNORMAL LOW (ref 8.9–10.3)
Chloride: 104 mmol/L (ref 98–111)
Creatinine, Ser: 0.97 mg/dL (ref 0.44–1.00)
GFR, Estimated: >60 mL/min (ref >60)
Glucose, Bld: 94 mg/dL (ref 70–99)
Potassium: 3.6 mmol/L (ref 3.5–5.1)
Sodium: 136 mmol/L (ref 135–145)
Total Bilirubin: 0.5 mg/dL (ref 0.3–1.2)
Total Protein: 7.5 g/dL (ref 6.5–8.1)

## 2021-04-16 LAB — CBC
HCT: 34.3 % — ABNORMAL LOW (ref 36.0–46.0)
Hemoglobin: 10.2 g/dL — ABNORMAL LOW (ref 12.0–15.0)
MCH: 22.7 pg — ABNORMAL LOW (ref 26.0–34.0)
MCHC: 29.7 g/dL — ABNORMAL LOW (ref 30.0–36.0)
MCV: 76.4 fL — ABNORMAL LOW (ref 80.0–100.0)
Platelets: 326 10*3/uL (ref 150–400)
RBC: 4.49 MIL/uL (ref 3.87–5.11)
RDW: 16.5 % — ABNORMAL HIGH (ref 11.5–15.5)
WBC: 9.9 10*3/uL (ref 4.0–10.5)
nRBC: 0 % (ref 0.0–0.2)

## 2021-04-16 LAB — I-STAT CHEM 8, ED
BUN: 12 mg/dL (ref 6–20)
Calcium, Ion: 1.15 mmol/L (ref 1.15–1.40)
Chloride: 104 mmol/L (ref 98–111)
Creatinine, Ser: 0.9 mg/dL (ref 0.44–1.00)
Glucose, Bld: 84 mg/dL (ref 70–99)
HCT: 37 % (ref 36.0–46.0)
Hemoglobin: 12.6 g/dL (ref 12.0–15.0)
Potassium: 3.7 mmol/L (ref 3.5–5.1)
Sodium: 140 mmol/L (ref 135–145)
TCO2: 27 mmol/L (ref 22–32)

## 2021-04-16 LAB — DIFFERENTIAL
Abs Immature Granulocytes: 0.02 10*3/uL (ref 0.00–0.07)
Basophils Absolute: 0 10*3/uL (ref 0.0–0.1)
Basophils Relative: 0 %
Eosinophils Absolute: 0.2 10*3/uL (ref 0.0–0.5)
Eosinophils Relative: 2 %
Immature Granulocytes: 0 %
Lymphocytes Relative: 29 %
Lymphs Abs: 2.9 10*3/uL (ref 0.7–4.0)
Monocytes Absolute: 0.7 10*3/uL (ref 0.1–1.0)
Monocytes Relative: 7 %
Neutro Abs: 6.1 10*3/uL (ref 1.7–7.7)
Neutrophils Relative %: 62 %

## 2021-04-16 LAB — PROTIME-INR
INR: 1 (ref 0.8–1.2)
Prothrombin Time: 12.6 seconds (ref 11.4–15.2)

## 2021-04-16 LAB — I-STAT BETA HCG BLOOD, ED (MC, WL, AP ONLY): I-stat hCG, quantitative: 7.7 m[IU]/mL — ABNORMAL HIGH (ref <5)

## 2021-04-16 LAB — APTT: aPTT: 27 seconds (ref 24–36)

## 2021-04-16 LAB — ETHANOL: Alcohol, Ethyl (B): <10 mg/dL (ref <10)

## 2021-04-16 LAB — BRAIN NATRIURETIC PEPTIDE: B Natriuretic Peptide: 37.4 pg/mL (ref 0.0–100.0)

## 2021-04-16 IMAGING — MR MR MRA NECK W/O CM
5 of 9 series · 33 of 48 positions shown · non-contrast
Comparison: Prior study from [DATE].

CLINICAL DATA: Initial evaluation for neuro deficit, stroke
suspected.



[Series 7: tof_fl3d_tra_iso · axial · 0.6mm · 0.52mm/px · z∈[-135,-60]mm · 11 of 133 slices shown (1 of 2)]
[im 1/133]
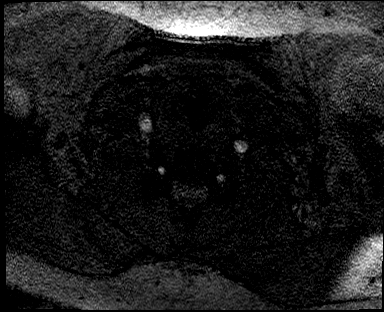
[im 14/133]
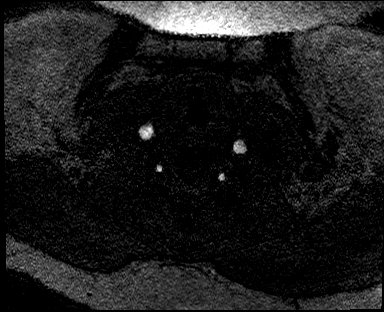
[im 27/133]
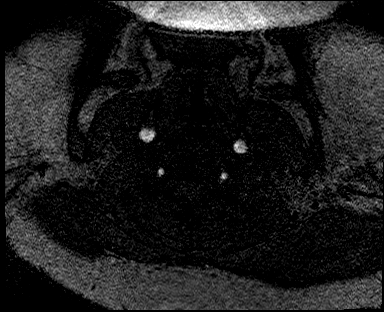
[im 40/133]
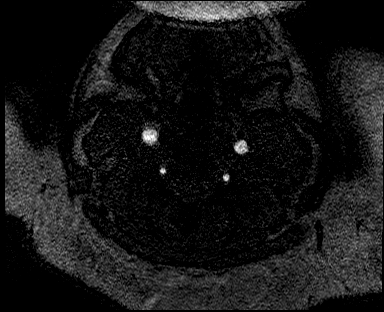
[im 53/133]
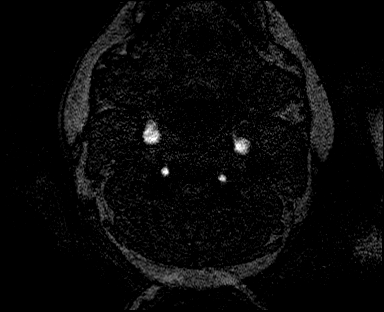
[im 67/133]
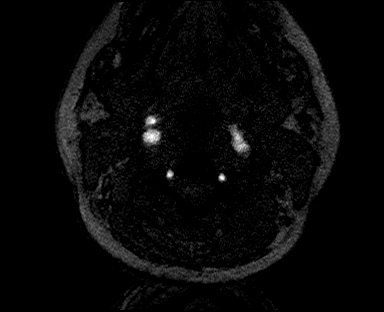
[im 80/133]
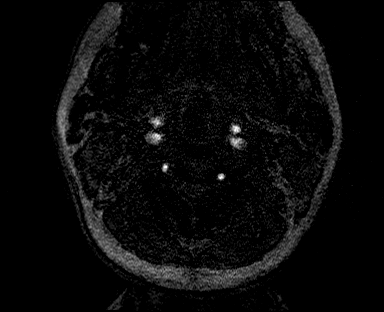
[im 93/133]
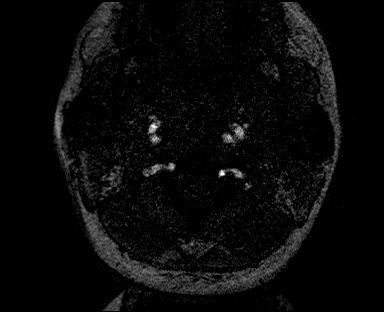
[im 106/133]
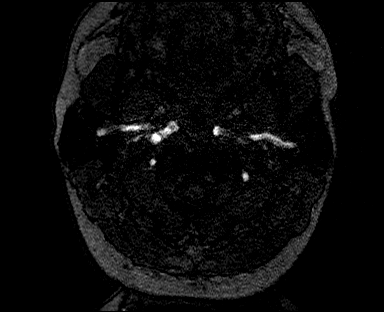
[im 119/133]
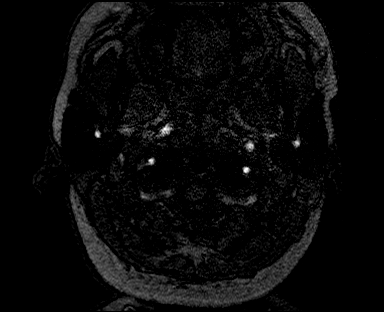
[im 133/133]
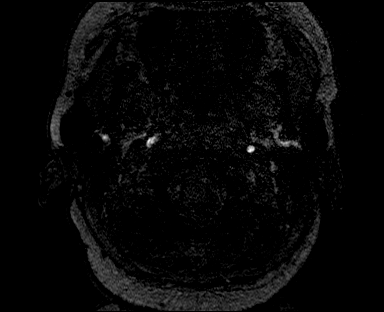

[Series 10: angio_fl3d_cor_pre_ttc=3.0s · coronal · 0.9mm · 0.85mm/px · 7 of 88 slices shown]
[im 1/88]
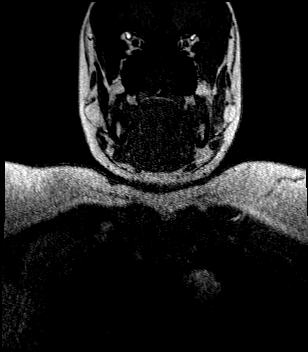
[im 15/88]
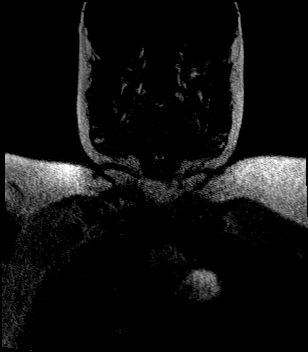
[im 30/88]
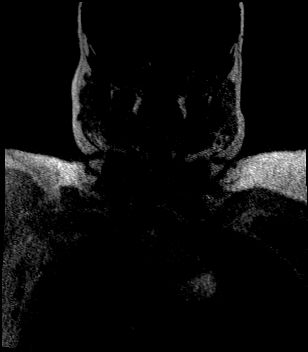
[im 44/88]
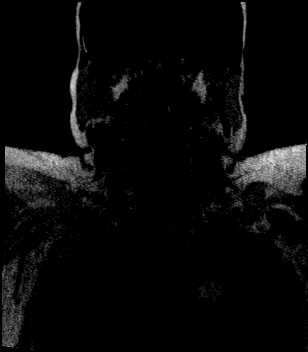
[im 59/88]
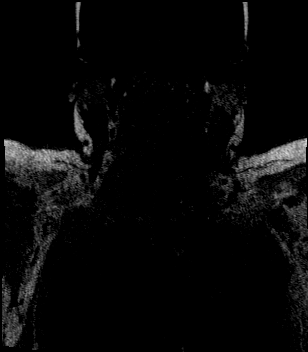
[im 73/88]
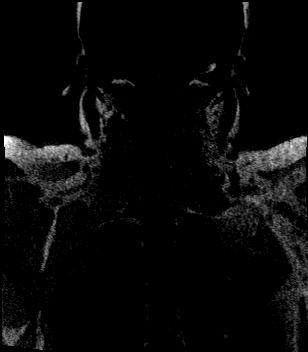
[im 88/88]
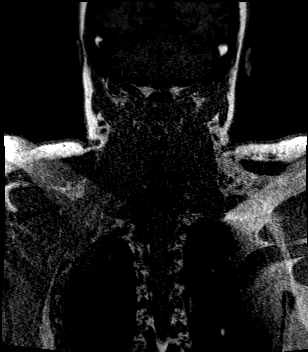

[Series 14: T2 · coronal · 5.0mm · 0.72mm/px · 3 of 34 slices shown]
[im 1/34]
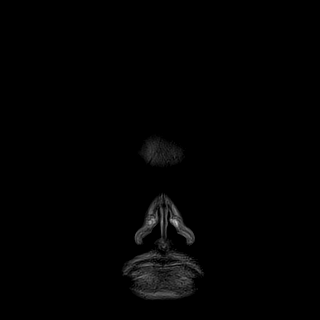
[im 17/34]
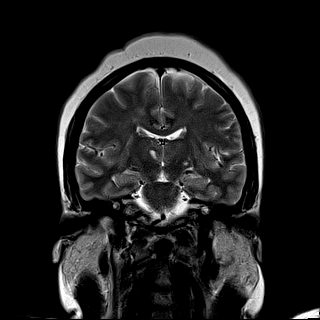
[im 34/34]
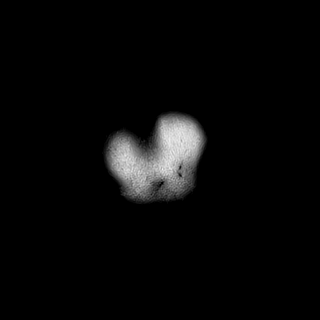

[Series 15: tof_fl3d_tra_iso · axial · 0.9mm · 0.62mm/px · z∈[-227,-19]mm · 11 of 267 slices shown (2 of 2)]
[im 13/267]
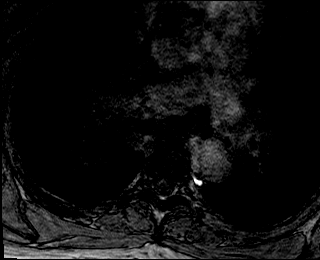
[im 39/267]
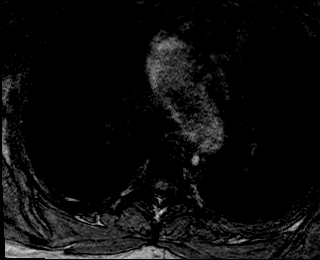
[im 51/267]
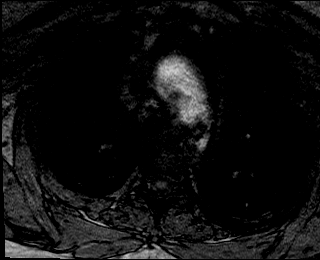
[im 77/267]
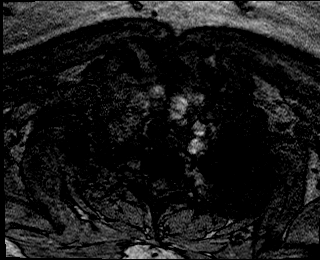
[im 115/267]
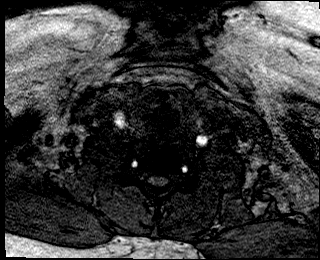
[im 140/267]
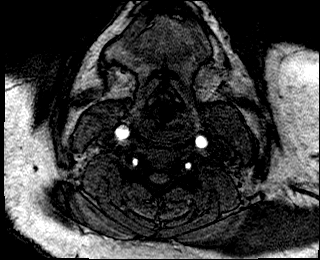
[im 153/267]
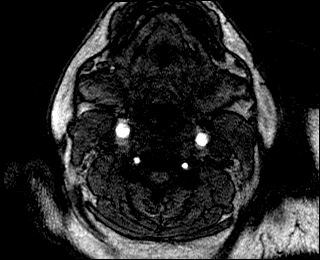
[im 191/267]
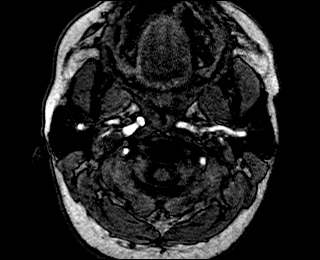
[im 216/267]
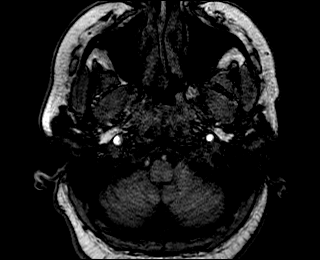
[im 229/267]
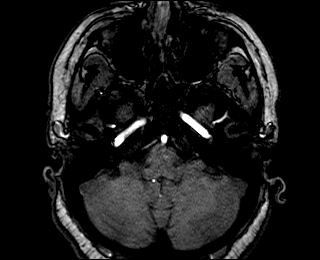
[im 254/267]
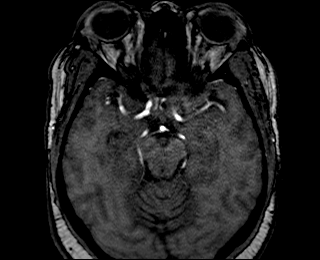

[Series 1003: rt bifur rotate · sagittal · 0.5mm · 0.20mm/px · 1 of 8 slices shown]
[im 1/8]
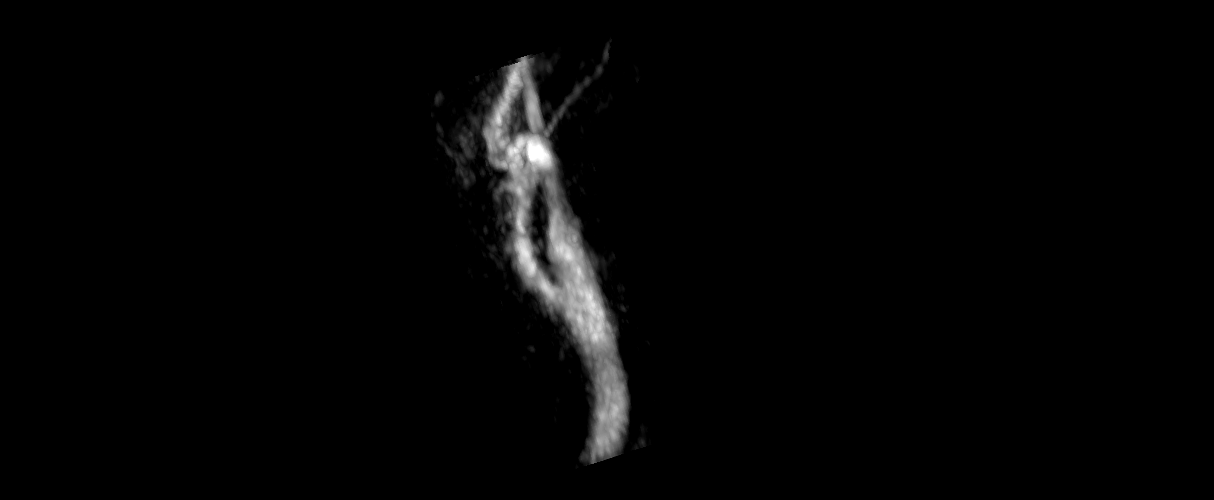

[33 of 48 positions shown; findings below may reference images not displayed]

FINDINGS: MRI HEAD FINDINGS

Brain: Cerebral volume within normal limits. Moderate chronic
microvascular ischemic disease noted involving the periventricular
and deep white matter both cerebral hemispheres. Multiple scattered
remote lacunar infarcts present about the deep gray nuclei and left
midbrain/pons.

No evidence for acute or subacute infarct. Gray-white matter
differentiation maintained. No acute intracranial hemorrhage. Few
small chronic micro hemorrhages noted about the thalami, likely
hypertensive in nature.

No mass lesion or midline shift. No hydrocephalus or extra-axial
fluid collection. Pituitary gland and suprasellar region normal.

Vascular: Major intracranial vascular flow voids are maintained.

Skull and upper cervical spine: Craniocervical junction normal. Bone
marrow signal intensity diffusely decreased on T1 weighted sequence,
nonspecific, but most commonly related to anemia, smoking or
obesity. No scalp soft tissue abnormality.

Sinuses/Orbits: Globes orbital soft tissues within normal limits.
Scattered mucosal thickening noted within the ethmoidal air cells.
Paranasal sinuses are otherwise clear. No mastoid effusion.

Other: None.

MRA HEAD FINDINGS

ANTERIOR CIRCULATION:

Examination moderately degraded by motion artifact.

Visualized distal cervical segments of the internal carotid arteries
are patent with antegrade flow. Petrous, cavernous, and supraclinoid
segments patent without significant stenosis. A1 segments, anterior
communicating artery complex, and anterior cerebral arteries patent
bilaterally. No M1 stenosis or occlusion. No proximal MCA branch
occlusion. Diffuse atheromatous irregularity throughout the MCA
branches bilaterally.

POSTERIOR CIRCULATION:

Both vertebral arteries patent without stenosis. Both PICA patent.
Basilar patent without stenosis. Superior cerebral arteries patent
bilaterally. Both PCAs primarily supplied via the basilar and are
widely patent without proximal stenosis.

No intracranial aneurysm.

MRA NECK FINDINGS

AORTIC ARCH: Examination degraded by motion artifact and lack of IV
contrast.

Visualized aortic arch grossly within normal limits for caliber with
normal branch pattern. No stenosis about the origin the great
vessels.

RIGHT CAROTID SYSTEM: Right common and internal carotid arteries
tortuous but patent without significant stenosis or evidence for
dissection. No significant atheromatous narrowing about the right
carotid bifurcation.

LEFT CAROTID SYSTEM: Left common internal carotid arteries tortuous
but patent without significant stenosis or evidence for dissection.
No significant atheromatous narrowing about the left carotid
bifurcation.

VERTEBRAL ARTERIES: Both vertebral arteries arise from subclavian
arteries. Vertebral arteries mildly tortuous but appear patent
without significant stenosis or evidence for dissection.
IMPRESSION: MRI HEAD IMPRESSION:

1. No acute intracranial abnormality.
2. Moderate chronic microvascular ischemic disease with multiple
remote lacunar infarcts about the deep gray nuclei and left
midbrain/pons.

MRA HEAD IMPRESSION:

1. Motion degraded exam.
2. Negative intracranial MRA for large vessel occlusion.
3. Diffuse small vessel atheromatous irregularity throughout the
intracranial circulation.

MRA NECK IMPRESSION:

1. Negative MRA of the neck with no hemodynamically significant
stenosis or other acute finding.
2. Tortuosity of the major arterial vasculature of the neck,
suggesting chronic underlying hypertension.

## 2021-04-16 IMAGING — CR DG CHEST 2V
2 series · 2 of 2 positions shown · non-contrast
Comparison: AP chest [DATE]

CLINICAL DATA: Evaluate for fluid overload.

EXAM:
CHEST - 2 VIEW

[chest pa]
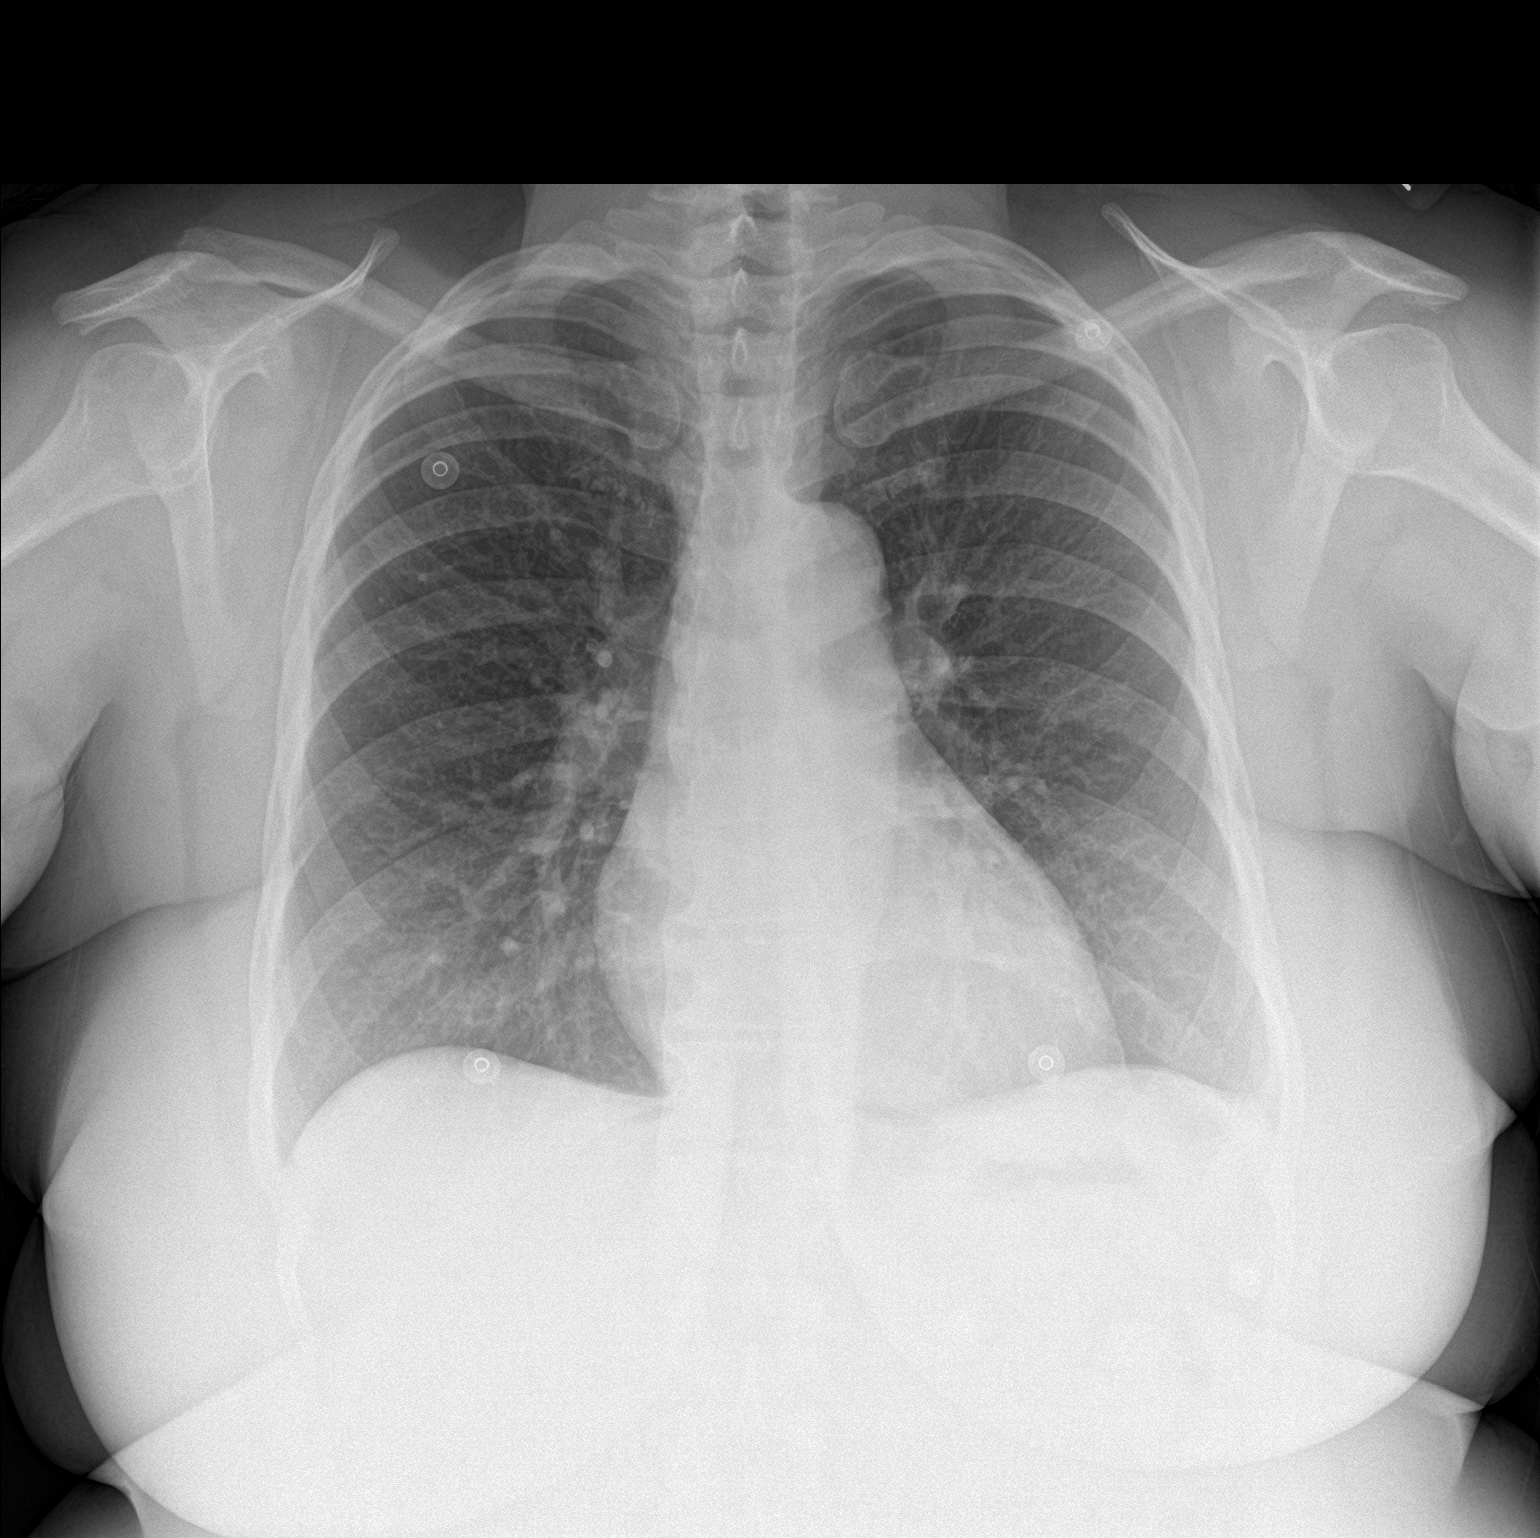

[chest lat]
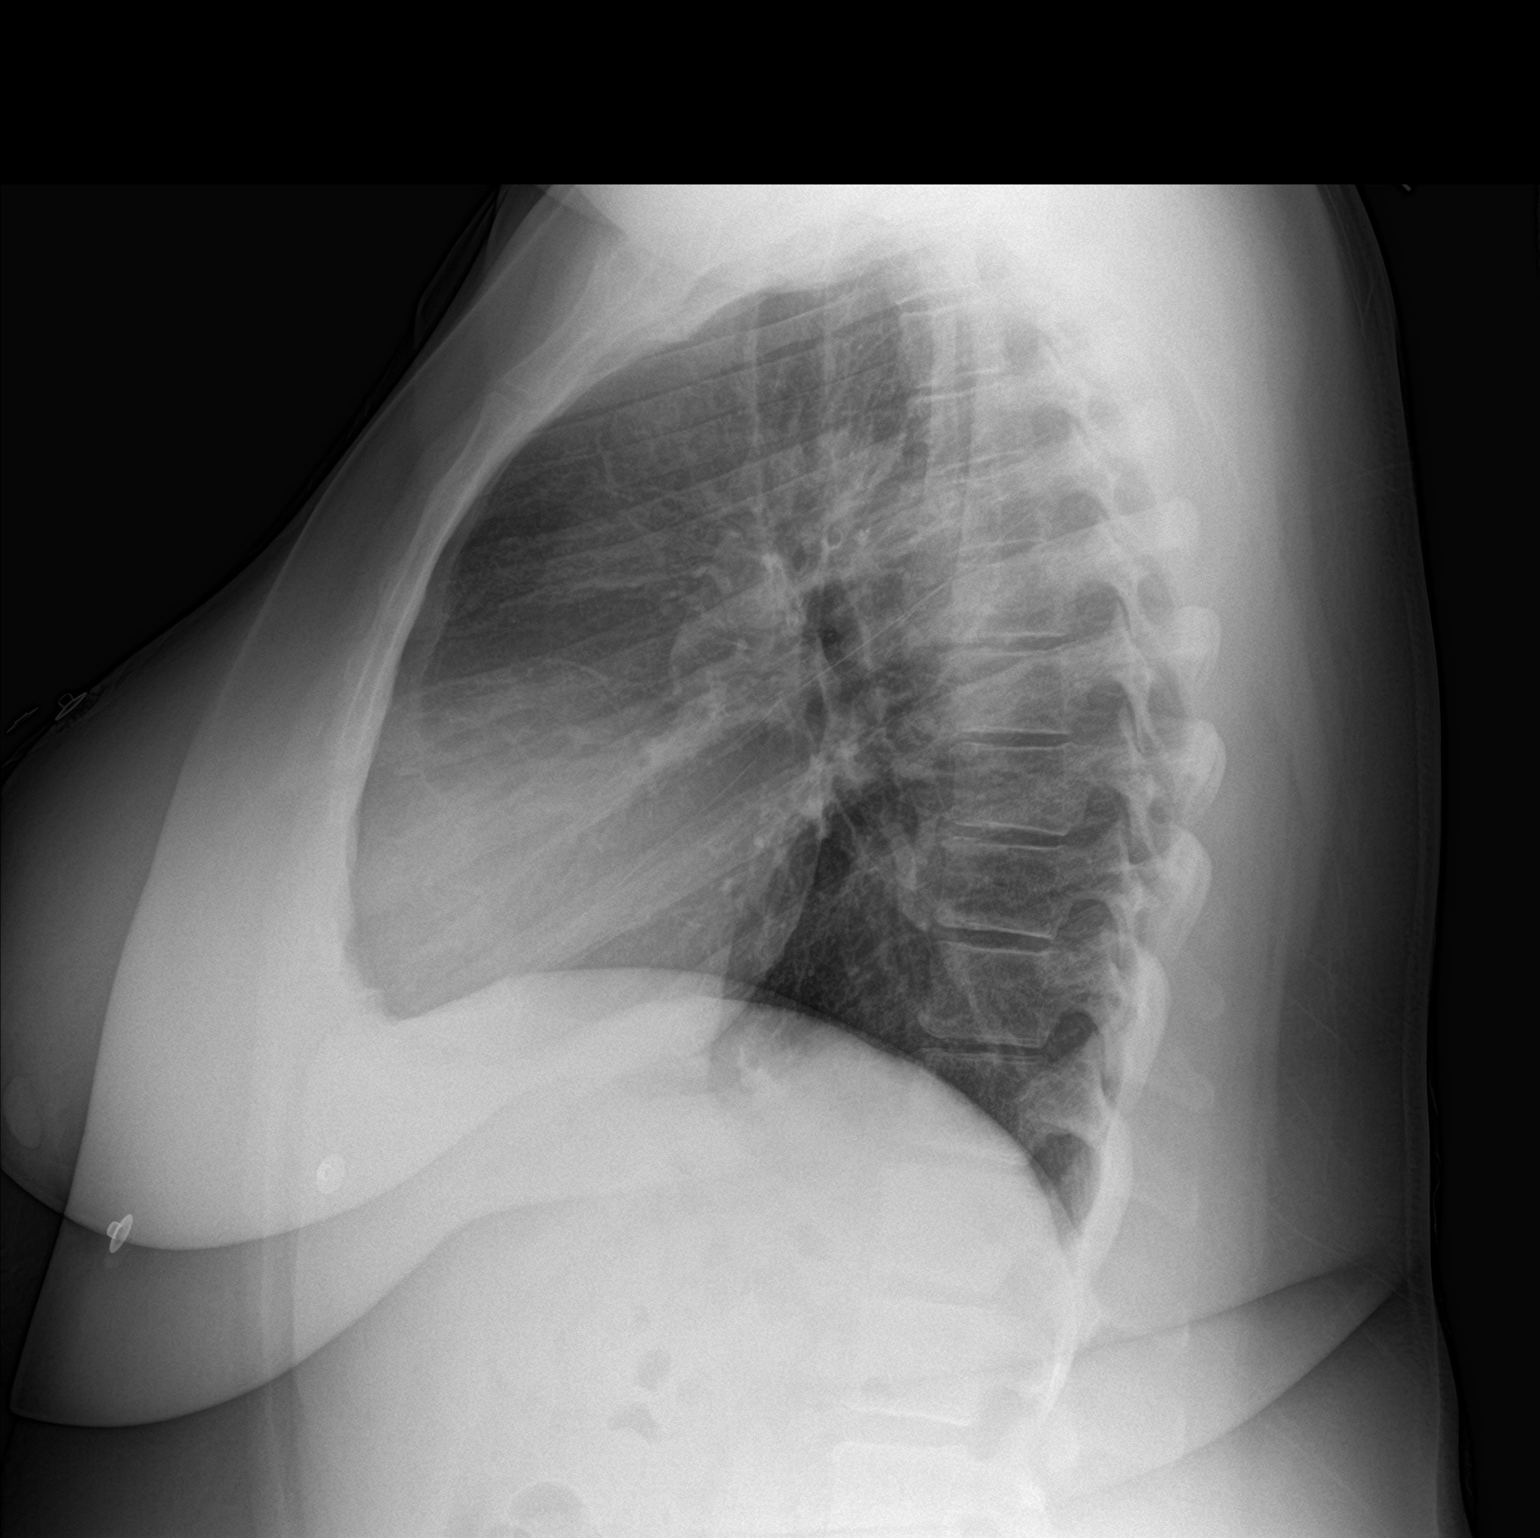

[2 of 2 positions shown; findings below may reference images not displayed]

FINDINGS: Cardiac silhouette is at the upper limits of normal size.
Mediastinal contours are within normal limits. The lungs are clear.
No pleural effusion or pneumothorax. No acute skeletal abnormality.
IMPRESSION: No active cardiopulmonary disease.

## 2021-04-16 IMAGING — MR MR HEAD W/O CM
13 of 14 series · 44 of 48 positions shown · non-contrast
Comparison: Prior study from [DATE].

CLINICAL DATA: Initial evaluation for neuro deficit, stroke
suspected.



[Series 5: DWI · axial · 3.0mm · 0.92mm/px · z∈[-86,+78]mm · 7 of 116 slices shown (1 of 4)]
[im 1/116]
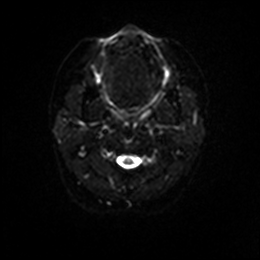
[im 20/116]
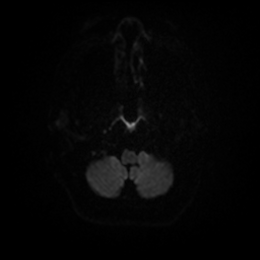
[im 39/116]
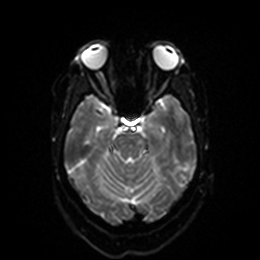
[im 58/116]
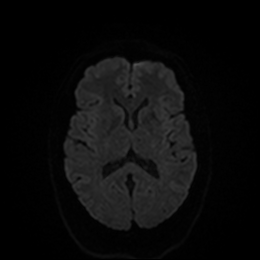
[im 77/116]
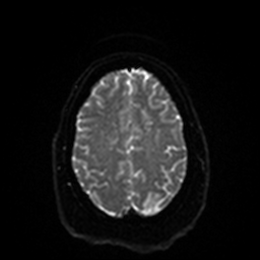
[im 96/116]
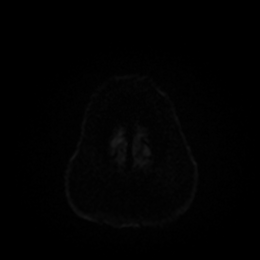
[im 116/116]
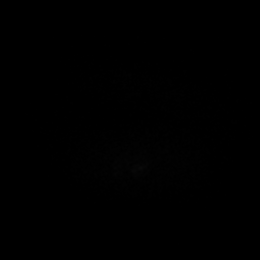

[Series 6: DWI · axial · 3.0mm · 0.92mm/px · z∈[-86,+78]mm · 4 of 58 slices shown (2 of 4)]
[im 1/58]
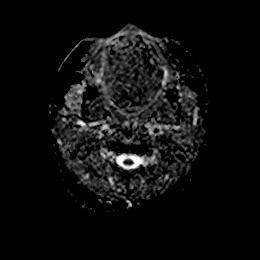
[im 20/58]
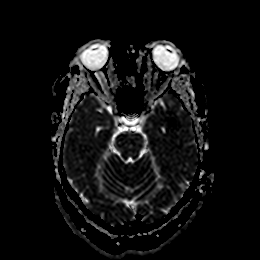
[im 39/58]
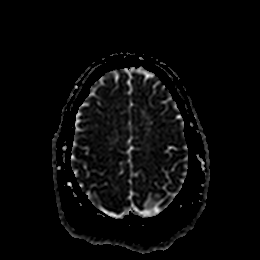
[im 58/58]
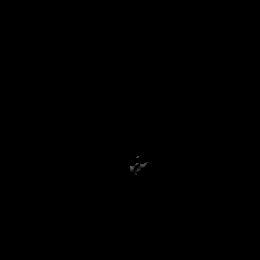

[Series 7: DWI · coronal · 4.0mm · 0.88mm/px · 5 of 82 slices shown (3 of 4)]
[im 1/82]
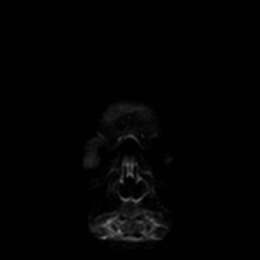
[im 21/82]
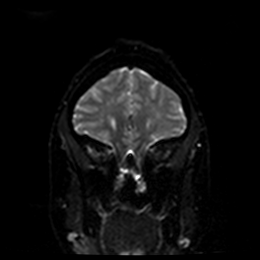
[im 41/82]
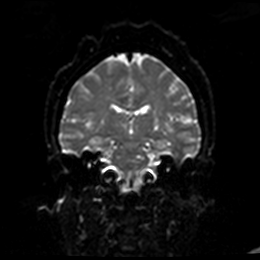
[im 61/82]
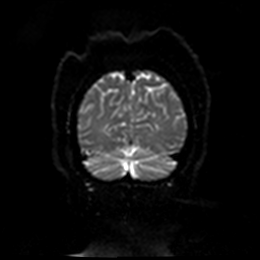
[im 82/82]
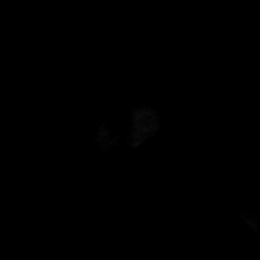

[Series 8: DWI · coronal · 4.0mm · 0.88mm/px · 3 of 41 slices shown (4 of 4)]
[im 1/41]
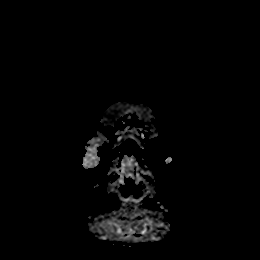
[im 21/41]
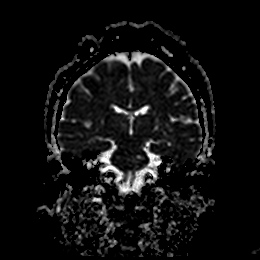
[im 41/41]
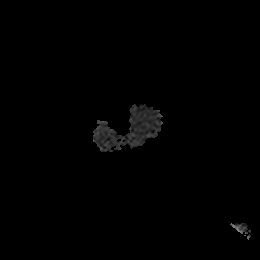

[Series 9: T1 · sagittal · 5.0mm · 0.78mm/px · 2 of 27 slices shown]
[im 1/27]
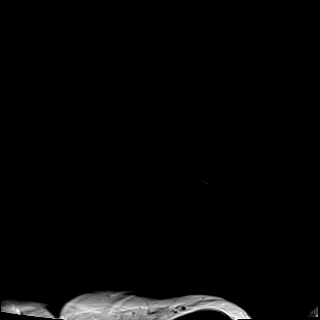
[im 27/27]
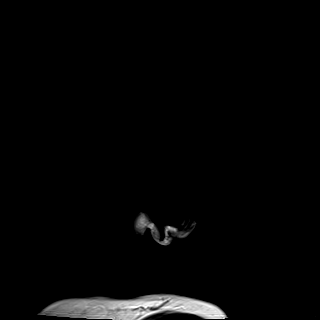

[Series 10: T2 · axial · 5.0mm · 0.75mm/px · z∈[-84,+77]mm · 2 of 29 slices shown (1 of 2)]
[im 1/29]
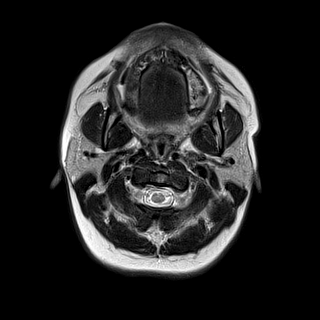
[im 29/29]
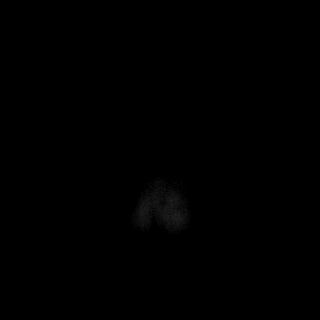

[Series 11: FLAIR · axial · 5.0mm · 0.47mm/px · z∈[-86,+75]mm · 2 of 29 slices shown (1 of 2)]
[im 1/29]
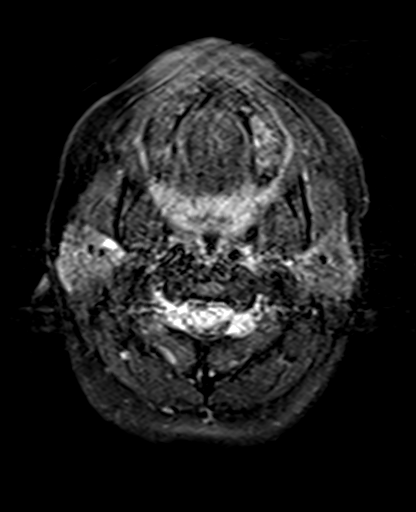
[im 29/29]
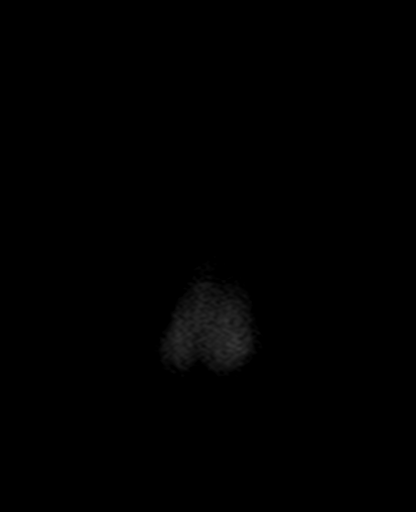

[Series 12: mag_images · axial · 3.0mm · 0.94mm/px · z∈[-84,+74]mm · 4 of 56 slices shown]
[im 1/56]
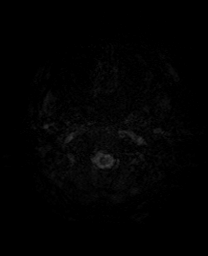
[im 19/56]
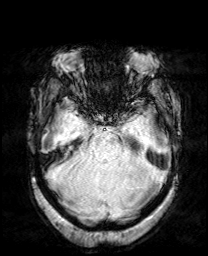
[im 37/56]
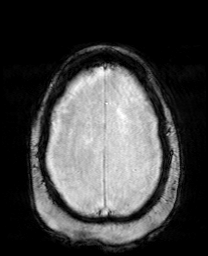
[im 56/56]
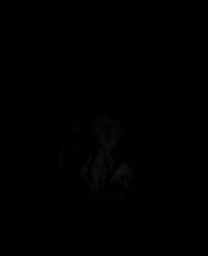

[Series 13: pha_images · axial · 3.0mm · 0.94mm/px · z∈[-82,+74]mm · 4 of 55 slices shown]
[im 1/55]
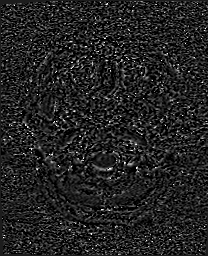
[im 19/55]
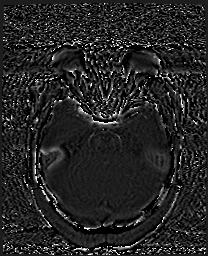
[im 37/55]
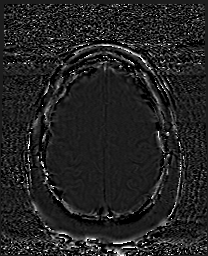
[im 55/55]
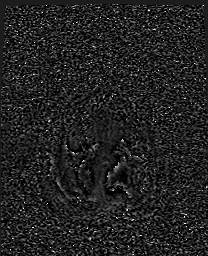

[Series 14: swi_images · axial · 3.0mm · 0.94mm/px · z∈[-84,+74]mm · 4 of 56 slices shown]
[im 1/56]
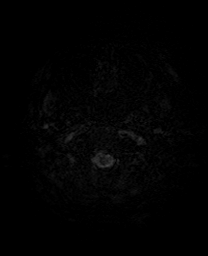
[im 19/56]
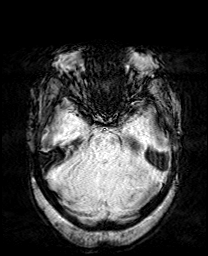
[im 37/56]
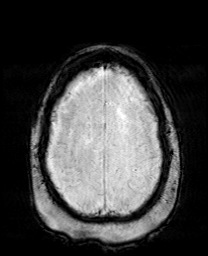
[im 56/56]
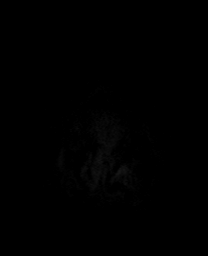

[Series 15: mip_images(sw) · axial · 24.0mm · 0.94mm/px · z∈[-74,+64]mm · 3 of 49 slices shown]
[im 1/49]
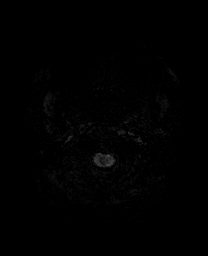
[im 25/49]
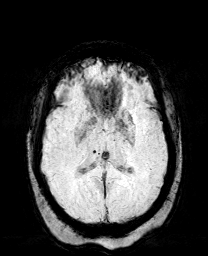
[im 49/49]
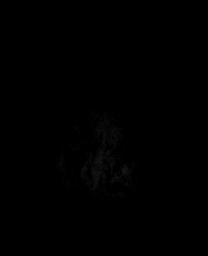

[Series 16: FLAIR · axial · 5.0mm · 0.94mm/px · z∈[-84,+77]mm · 2 of 29 slices shown (2 of 2)]
[im 1/29]
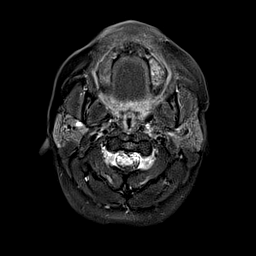
[im 29/29]
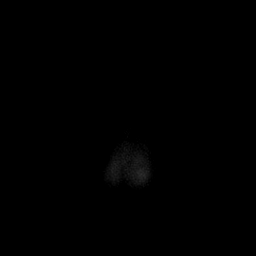

[Series 18: T2 · coronal · 5.0mm · 0.34mm/px · 2 of 34 slices shown (2 of 2)]
[im 1/34]
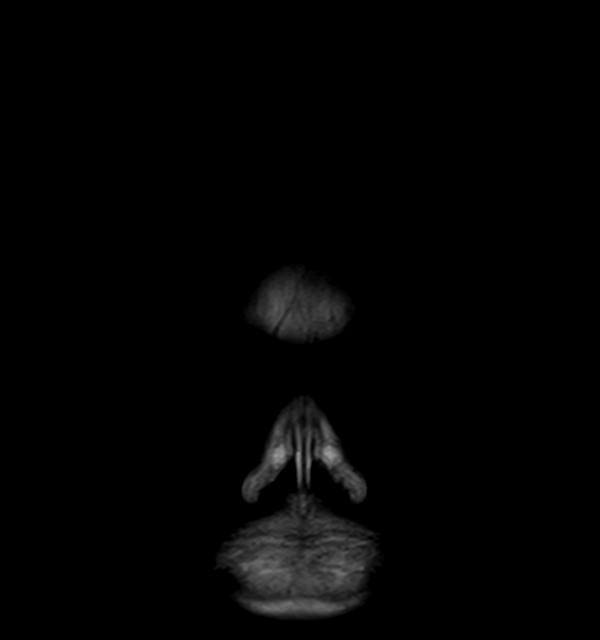
[im 34/34]
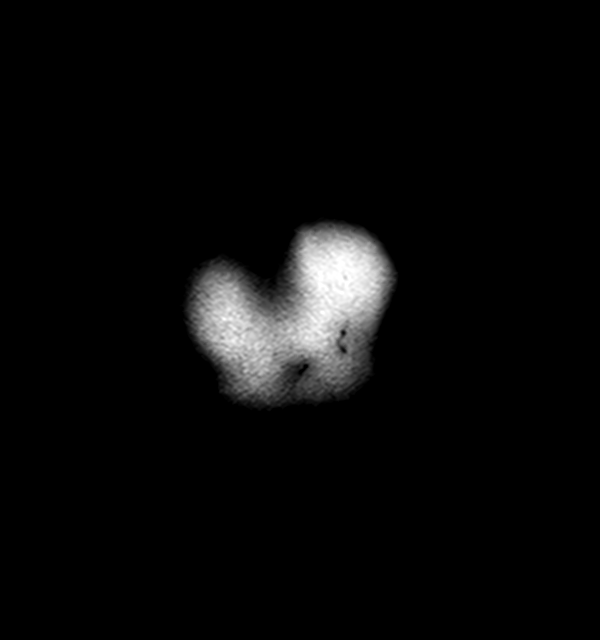

[44 of 48 positions shown; findings below may reference images not displayed]

FINDINGS: MRI HEAD FINDINGS

Brain: Cerebral volume within normal limits. Moderate chronic
microvascular ischemic disease noted involving the periventricular
and deep white matter both cerebral hemispheres. Multiple scattered
remote lacunar infarcts present about the deep gray nuclei and left
midbrain/pons.

No evidence for acute or subacute infarct. Gray-white matter
differentiation maintained. No acute intracranial hemorrhage. Few
small chronic micro hemorrhages noted about the thalami, likely
hypertensive in nature.

No mass lesion or midline shift. No hydrocephalus or extra-axial
fluid collection. Pituitary gland and suprasellar region normal.

Vascular: Major intracranial vascular flow voids are maintained.

Skull and upper cervical spine: Craniocervical junction normal. Bone
marrow signal intensity diffusely decreased on T1 weighted sequence,
nonspecific, but most commonly related to anemia, smoking or
obesity. No scalp soft tissue abnormality.

Sinuses/Orbits: Globes orbital soft tissues within normal limits.
Scattered mucosal thickening noted within the ethmoidal air cells.
Paranasal sinuses are otherwise clear. No mastoid effusion.

Other: None.

MRA HEAD FINDINGS

ANTERIOR CIRCULATION:

Examination moderately degraded by motion artifact.

Visualized distal cervical segments of the internal carotid arteries
are patent with antegrade flow. Petrous, cavernous, and supraclinoid
segments patent without significant stenosis. A1 segments, anterior
communicating artery complex, and anterior cerebral arteries patent
bilaterally. No M1 stenosis or occlusion. No proximal MCA branch
occlusion. Diffuse atheromatous irregularity throughout the MCA
branches bilaterally.

POSTERIOR CIRCULATION:

Both vertebral arteries patent without stenosis. Both PICA patent.
Basilar patent without stenosis. Superior cerebral arteries patent
bilaterally. Both PCAs primarily supplied via the basilar and are
widely patent without proximal stenosis.

No intracranial aneurysm.

MRA NECK FINDINGS

AORTIC ARCH: Examination degraded by motion artifact and lack of IV
contrast.

Visualized aortic arch grossly within normal limits for caliber with
normal branch pattern. No stenosis about the origin the great
vessels.

RIGHT CAROTID SYSTEM: Right common and internal carotid arteries
tortuous but patent without significant stenosis or evidence for
dissection. No significant atheromatous narrowing about the right
carotid bifurcation.

LEFT CAROTID SYSTEM: Left common internal carotid arteries tortuous
but patent without significant stenosis or evidence for dissection.
No significant atheromatous narrowing about the left carotid
bifurcation.

VERTEBRAL ARTERIES: Both vertebral arteries arise from subclavian
arteries. Vertebral arteries mildly tortuous but appear patent
without significant stenosis or evidence for dissection.
IMPRESSION: MRI HEAD IMPRESSION:

1. No acute intracranial abnormality.
2. Moderate chronic microvascular ischemic disease with multiple
remote lacunar infarcts about the deep gray nuclei and left
midbrain/pons.

MRA HEAD IMPRESSION:

1. Motion degraded exam.
2. Negative intracranial MRA for large vessel occlusion.
3. Diffuse small vessel atheromatous irregularity throughout the
intracranial circulation.

MRA NECK IMPRESSION:

1. Negative MRA of the neck with no hemodynamically significant
stenosis or other acute finding.
2. Tortuosity of the major arterial vasculature of the neck,
suggesting chronic underlying hypertension.

## 2021-04-16 IMAGING — MR MR MRA HEAD W/O CM
1 series · 17 of 48 positions shown · non-contrast
Comparison: Prior study from [DATE].

CLINICAL DATA: Initial evaluation for neuro deficit, stroke
suspected.



[Series 5: 3d cow · axial · 0.5mm · 0.41mm/px · z∈[-73,+12]mm · 17 of 188 slices shown]
[im 1/188]
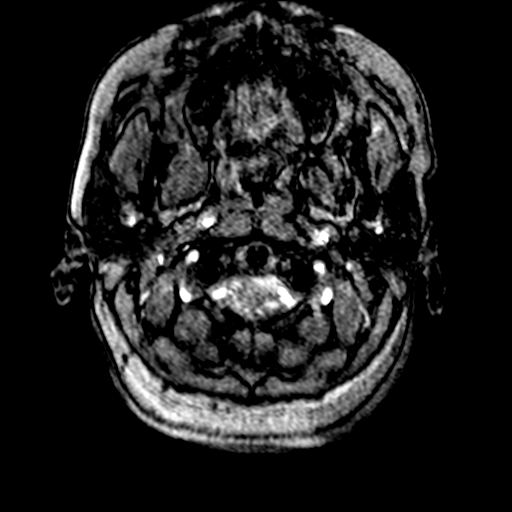
[im 4/188]
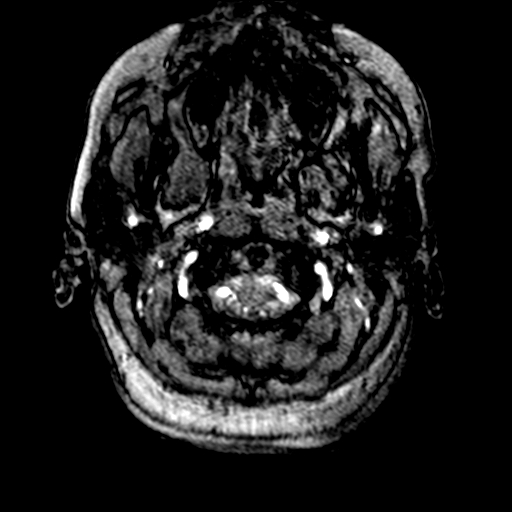
[im 8/188]
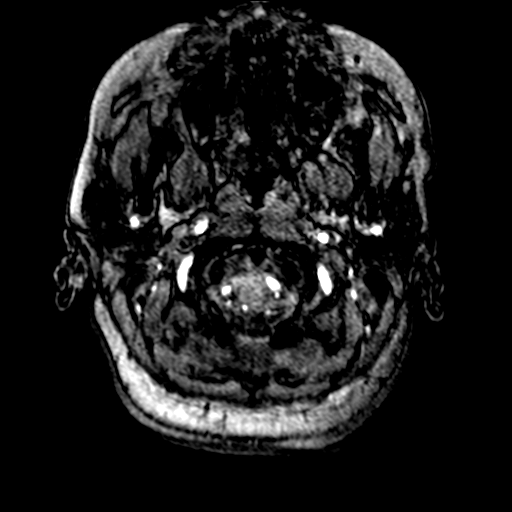
[im 12/188]
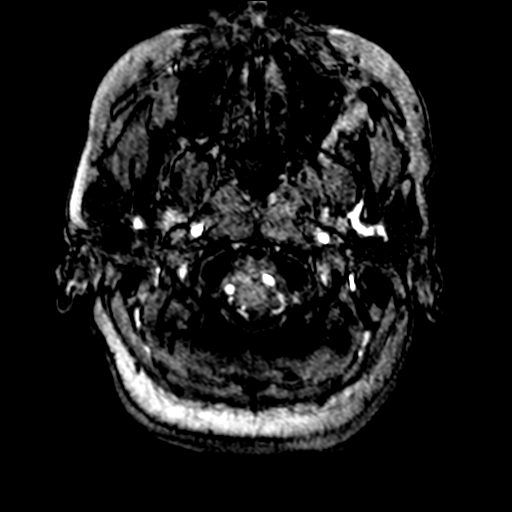
[im 16/188]
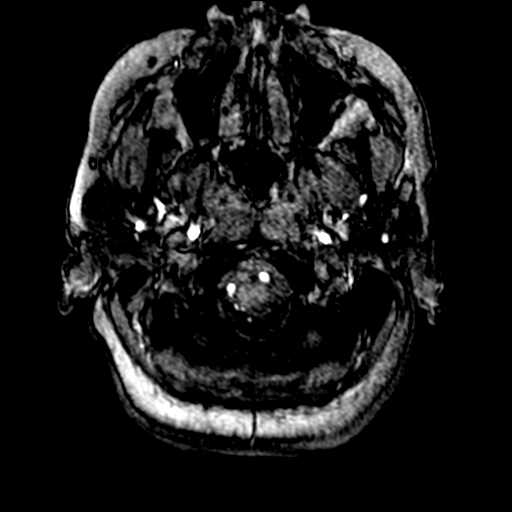
[im 20/188]
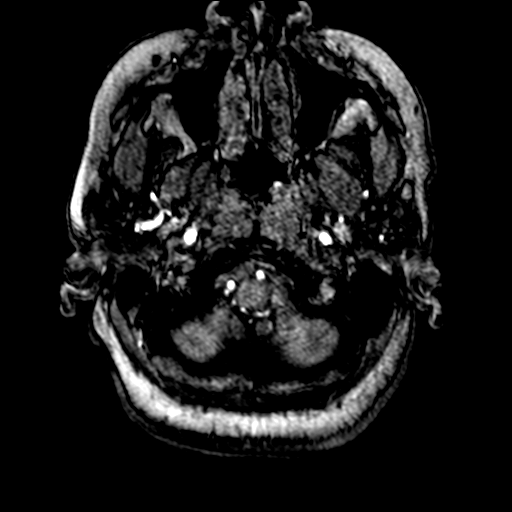
[im 24/188]
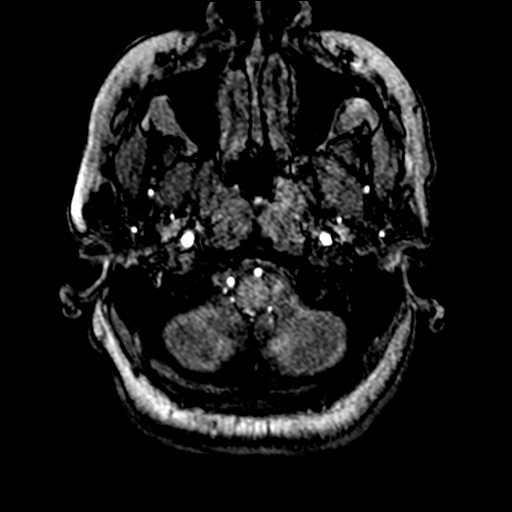
[im 32/188]
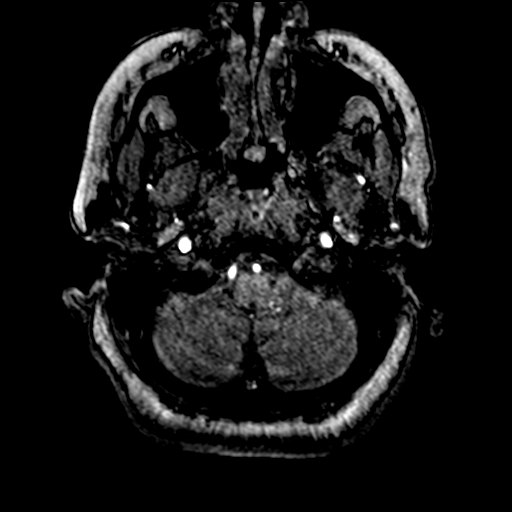
[im 36/188]
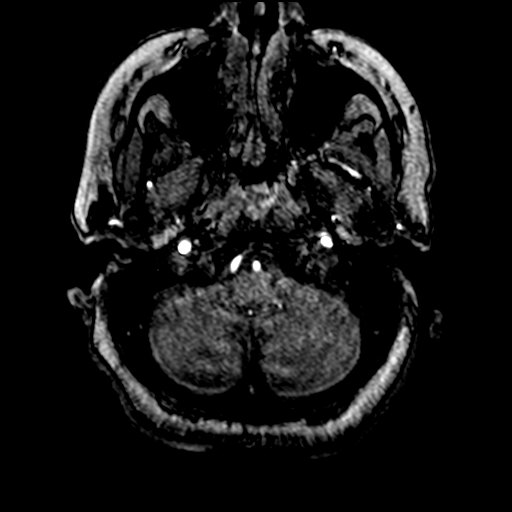
[im 60/188]
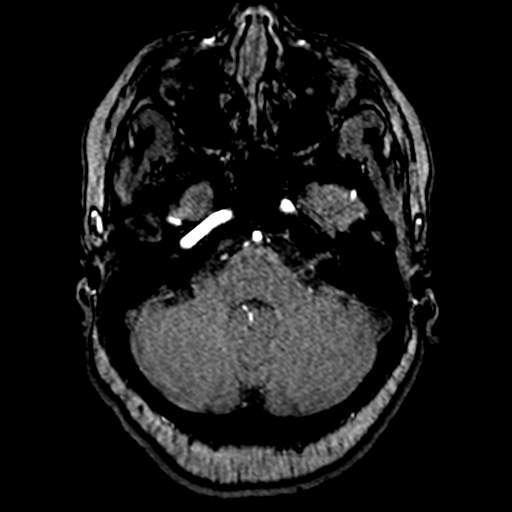
[im 84/188]
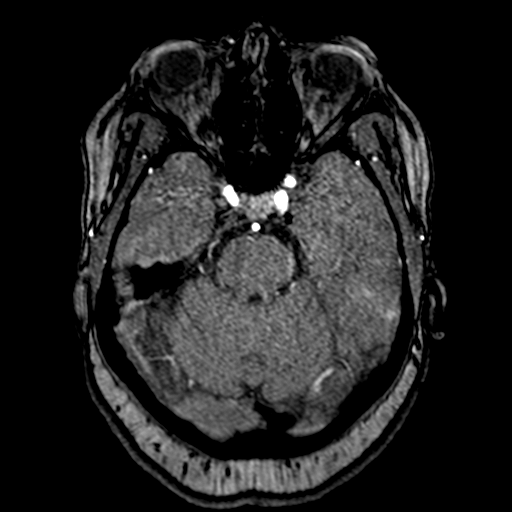
[im 96/188]
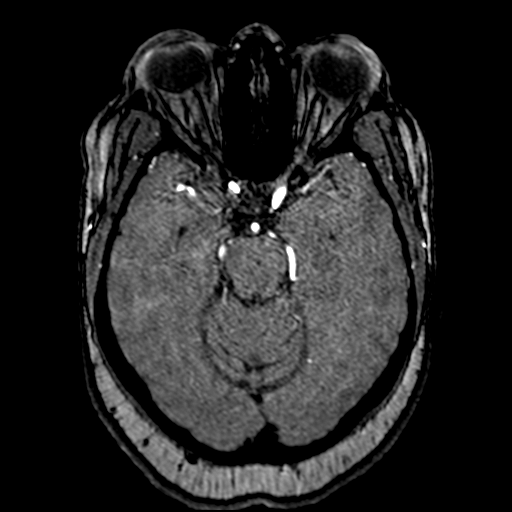
[im 108/188]
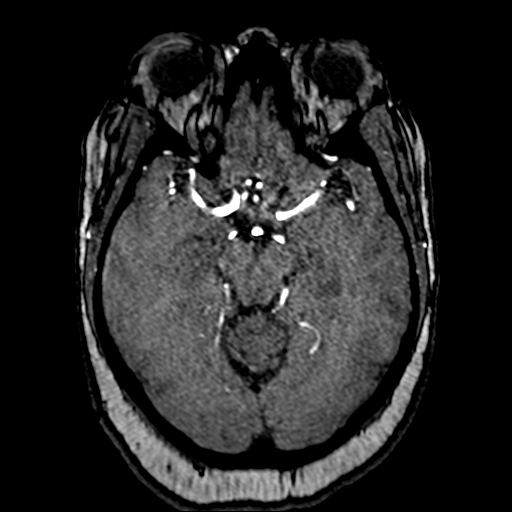
[im 132/188]
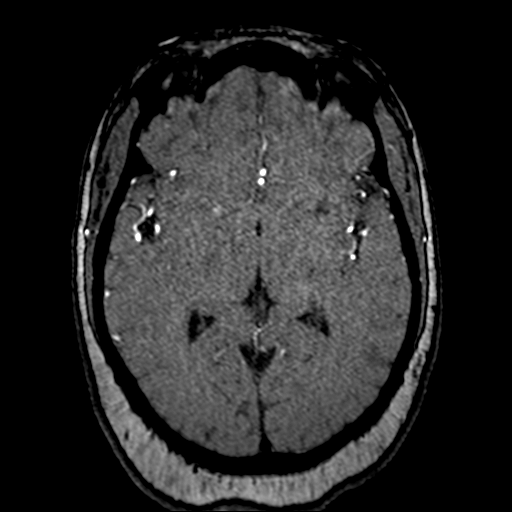
[im 156/188]
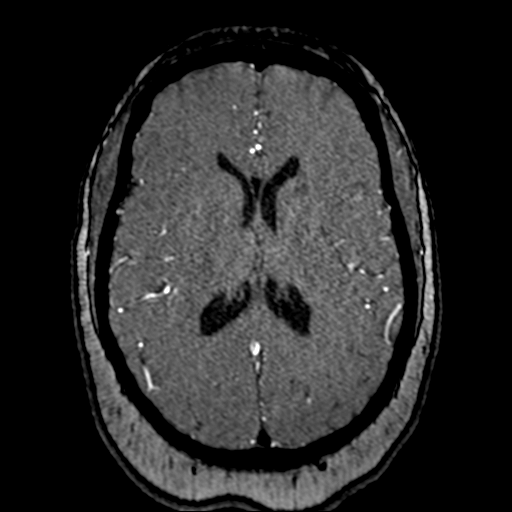
[im 160/188]
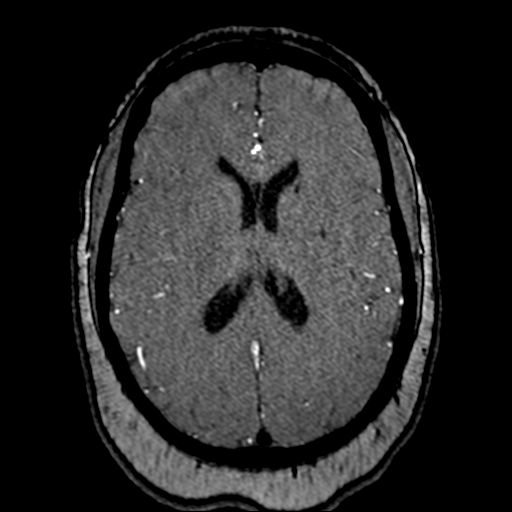
[im 180/188]
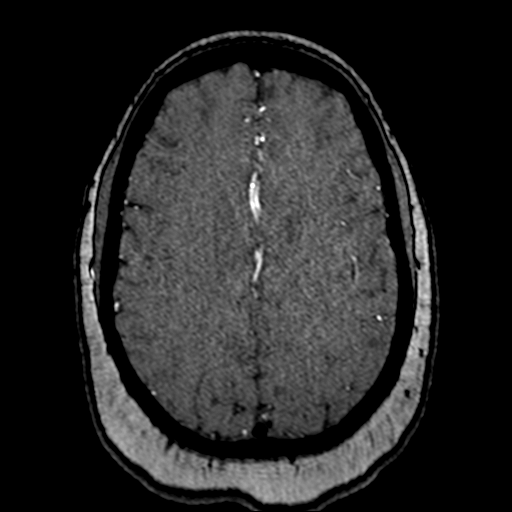

[17 of 48 positions shown; findings below may reference images not displayed]

FINDINGS: MRI HEAD FINDINGS

Brain: Cerebral volume within normal limits. Moderate chronic
microvascular ischemic disease noted involving the periventricular
and deep white matter both cerebral hemispheres. Multiple scattered
remote lacunar infarcts present about the deep gray nuclei and left
midbrain/pons.

No evidence for acute or subacute infarct. Gray-white matter
differentiation maintained. No acute intracranial hemorrhage. Few
small chronic micro hemorrhages noted about the thalami, likely
hypertensive in nature.

No mass lesion or midline shift. No hydrocephalus or extra-axial
fluid collection. Pituitary gland and suprasellar region normal.

Vascular: Major intracranial vascular flow voids are maintained.

Skull and upper cervical spine: Craniocervical junction normal. Bone
marrow signal intensity diffusely decreased on T1 weighted sequence,
nonspecific, but most commonly related to anemia, smoking or
obesity. No scalp soft tissue abnormality.

Sinuses/Orbits: Globes orbital soft tissues within normal limits.
Scattered mucosal thickening noted within the ethmoidal air cells.
Paranasal sinuses are otherwise clear. No mastoid effusion.

Other: None.

MRA HEAD FINDINGS

ANTERIOR CIRCULATION:

Examination moderately degraded by motion artifact.

Visualized distal cervical segments of the internal carotid arteries
are patent with antegrade flow. Petrous, cavernous, and supraclinoid
segments patent without significant stenosis. A1 segments, anterior
communicating artery complex, and anterior cerebral arteries patent
bilaterally. No M1 stenosis or occlusion. No proximal MCA branch
occlusion. Diffuse atheromatous irregularity throughout the MCA
branches bilaterally.

POSTERIOR CIRCULATION:

Both vertebral arteries patent without stenosis. Both PICA patent.
Basilar patent without stenosis. Superior cerebral arteries patent
bilaterally. Both PCAs primarily supplied via the basilar and are
widely patent without proximal stenosis.

No intracranial aneurysm.

MRA NECK FINDINGS

AORTIC ARCH: Examination degraded by motion artifact and lack of IV
contrast.

Visualized aortic arch grossly within normal limits for caliber with
normal branch pattern. No stenosis about the origin the great
vessels.

RIGHT CAROTID SYSTEM: Right common and internal carotid arteries
tortuous but patent without significant stenosis or evidence for
dissection. No significant atheromatous narrowing about the right
carotid bifurcation.

LEFT CAROTID SYSTEM: Left common internal carotid arteries tortuous
but patent without significant stenosis or evidence for dissection.
No significant atheromatous narrowing about the left carotid
bifurcation.

VERTEBRAL ARTERIES: Both vertebral arteries arise from subclavian
arteries. Vertebral arteries mildly tortuous but appear patent
without significant stenosis or evidence for dissection.
IMPRESSION: MRI HEAD IMPRESSION:

1. No acute intracranial abnormality.
2. Moderate chronic microvascular ischemic disease with multiple
remote lacunar infarcts about the deep gray nuclei and left
midbrain/pons.

MRA HEAD IMPRESSION:

1. Motion degraded exam.
2. Negative intracranial MRA for large vessel occlusion.
3. Diffuse small vessel atheromatous irregularity throughout the
intracranial circulation.

MRA NECK IMPRESSION:

1. Negative MRA of the neck with no hemodynamically significant
stenosis or other acute finding.
2. Tortuosity of the major arterial vasculature of the neck,
suggesting chronic underlying hypertension.

## 2021-04-16 IMAGING — CT CT HEAD W/O CM
4 series · 17 of 47 positions shown, 19 images · non-contrast
Comparison: [KI]

CLINICAL DATA: Transient ischemic attack (TIA)



[Series 3: head without · axial · non-contrast · 0.43mm/px · z∈[+1136,+1266]mm · 7 of 36 slices shown, 9 images]
[im 5/36  brain]
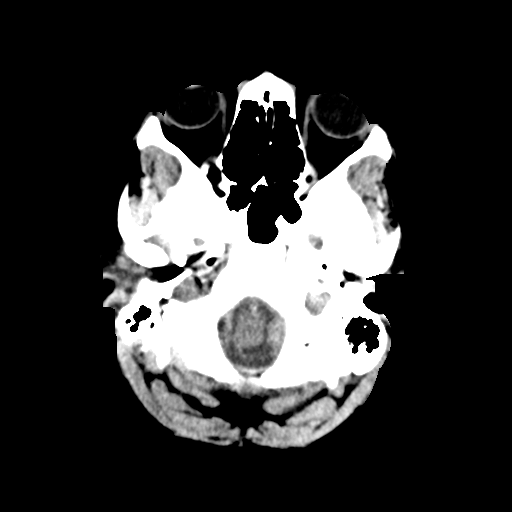
[im 5/36  bone]
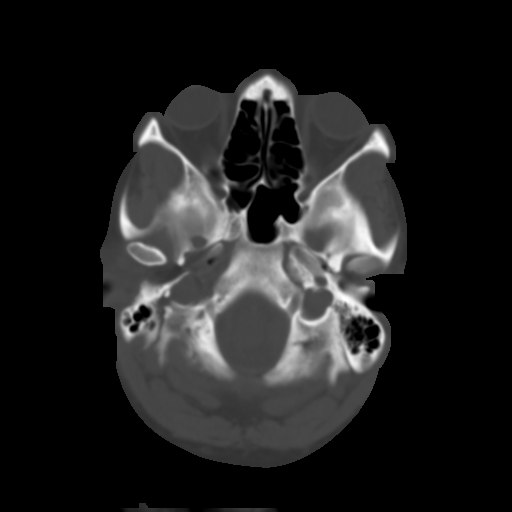
[im 9/36  brain]
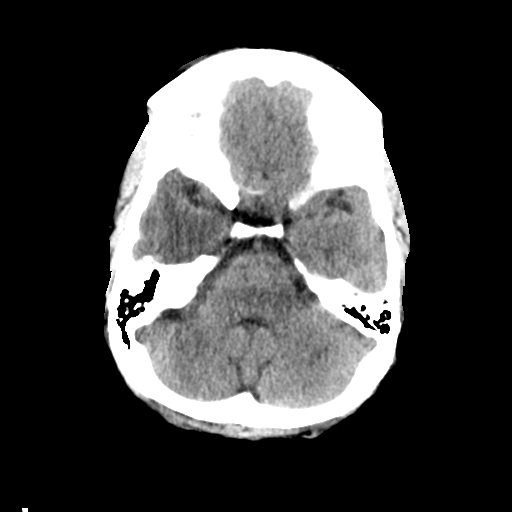
[im 14/36  brain]
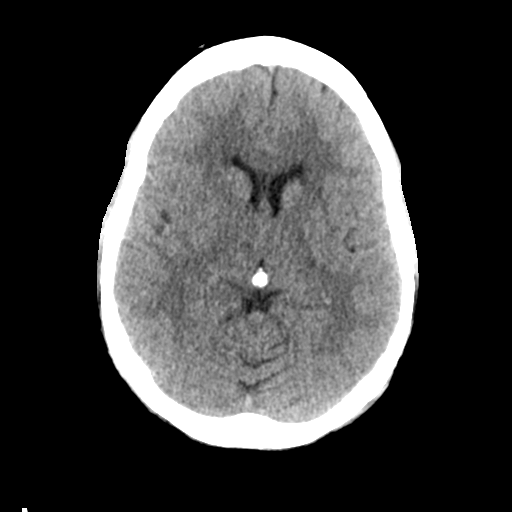
[im 18/36  brain]
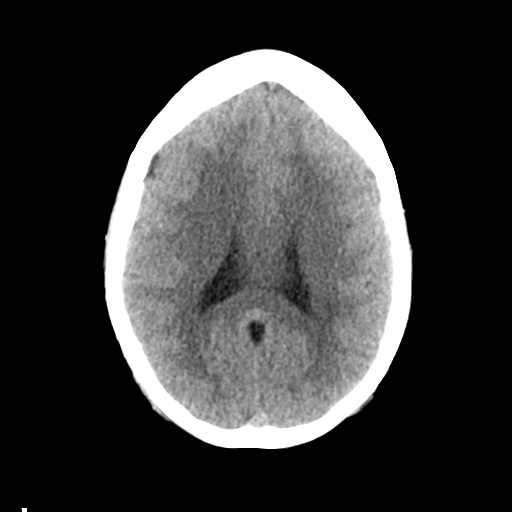
[im 22/36  brain]
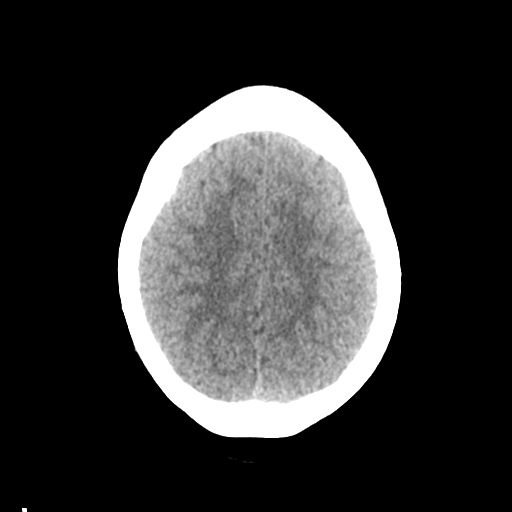
[im 22/36  bone]
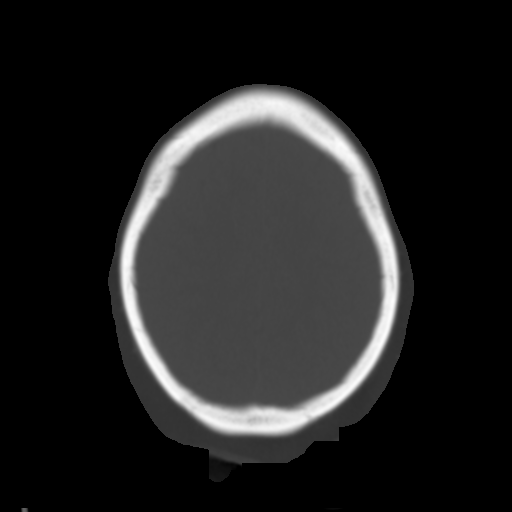
[im 27/36  brain]
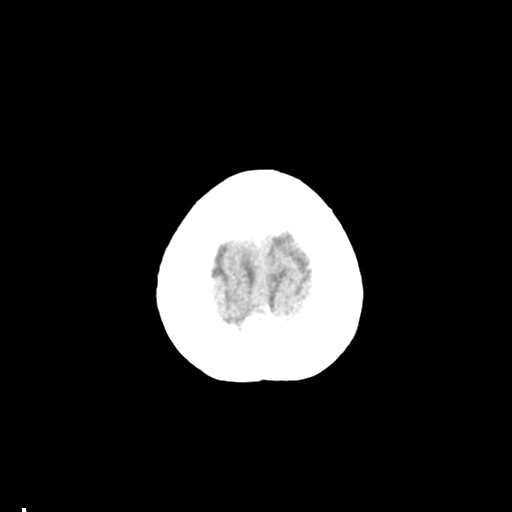
[im 31/36  brain]
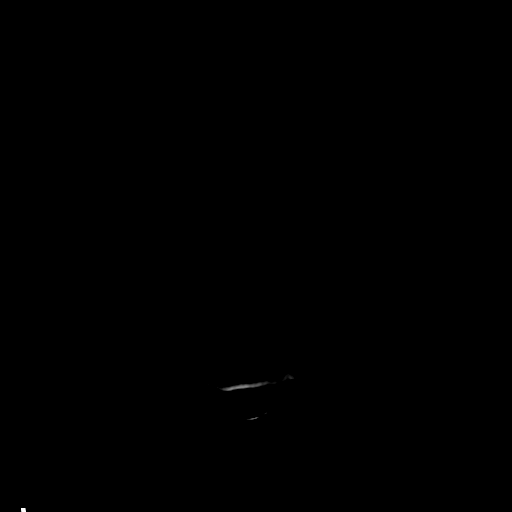

[Series 4: head bone · axial · 0.43mm/px · z∈[+1132,+1196]mm · 4 of 90 slices shown]
[im 9/90  bone]
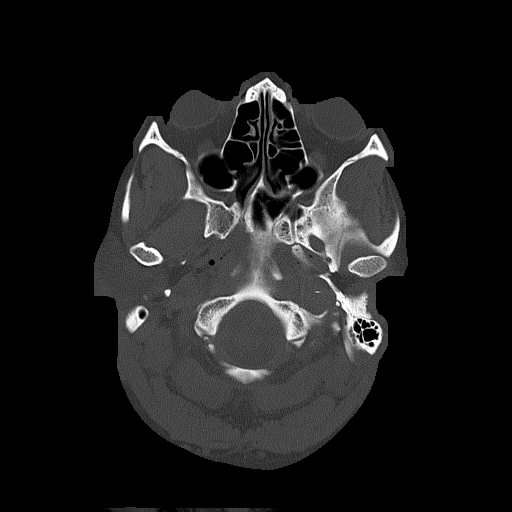
[im 18/90  bone]
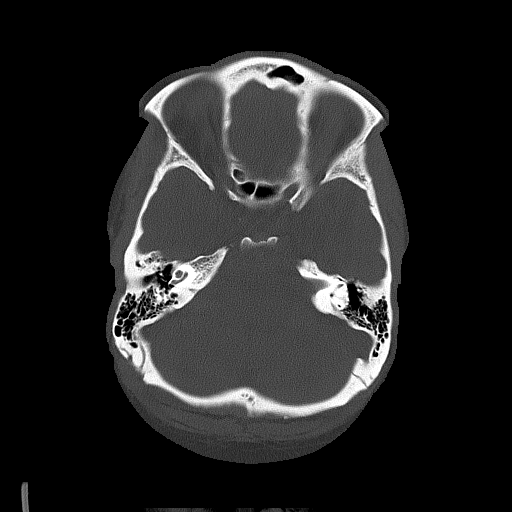
[im 27/90  bone]
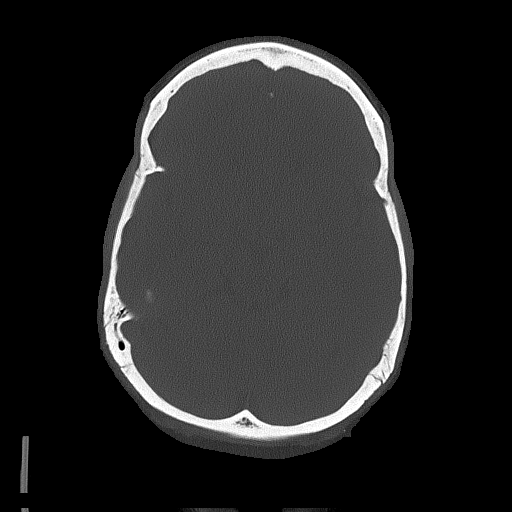
[im 41/90  bone]
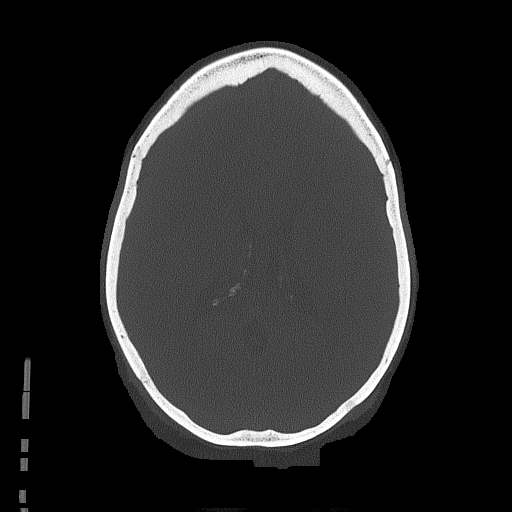

[Series 5: head without cor · coronal · non-contrast · 0.35mm/px · 3 of 71 slices shown]
[im 25/71  brain]
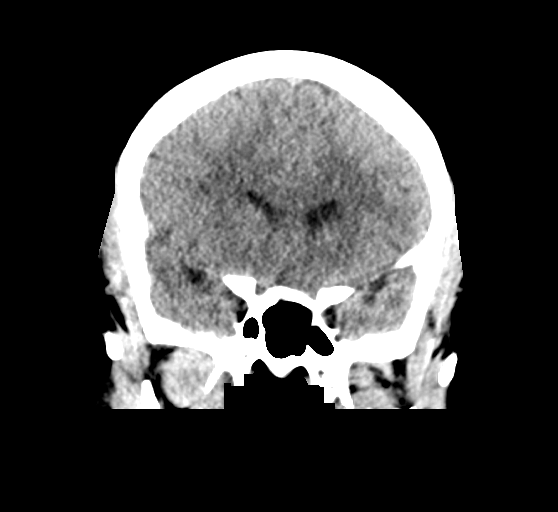
[im 32/71  brain]
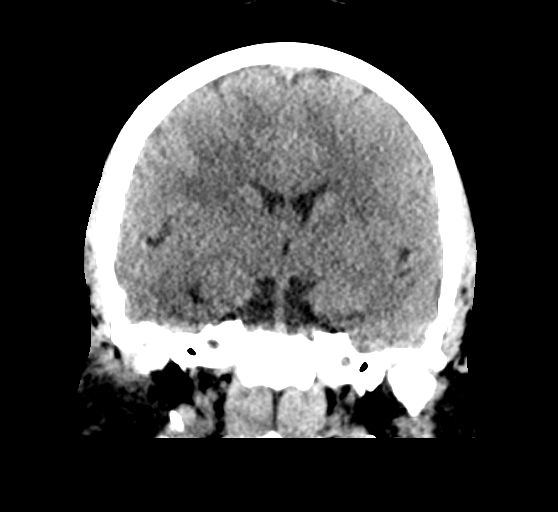
[im 39/71  brain]
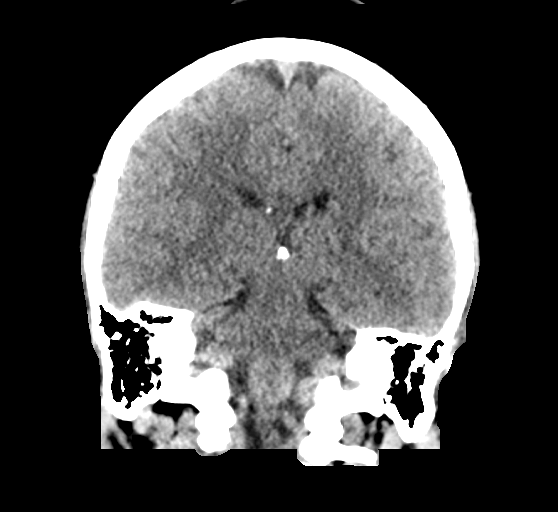

[Series 6: head without sag · sagittal · non-contrast · 0.35mm/px · 3 of 59 slices shown]
[im 20/59  brain]
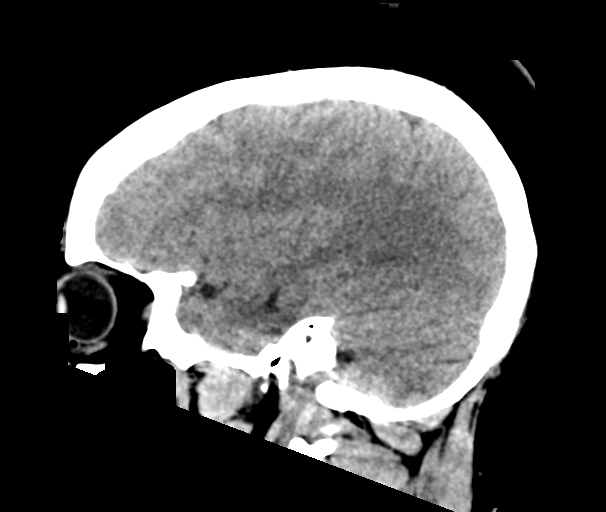
[im 30/59  brain]
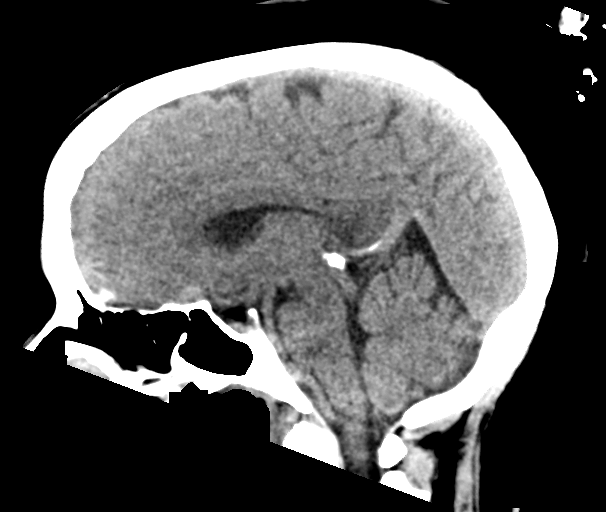
[im 39/59  brain]
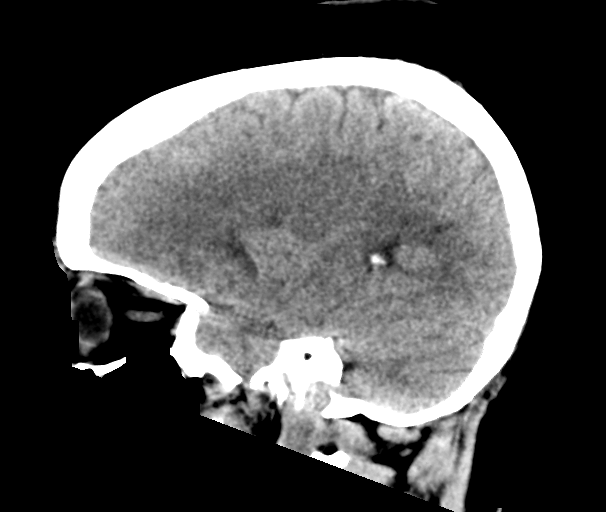

[17 of 47 positions shown; findings below may reference images not displayed]

FINDINGS: Brain: There is no acute intracranial hemorrhage, mass effect, or
edema. Gray-white differentiation is preserved. Chronic infarct of
the left basal ganglia and adjacent white matter. New
age-indeterminate small right thalamic infarct. There is no
extra-axial fluid collection. Ventricles and sulci are within normal
limits in size and configuration.

Vascular: No hyperdense vessel.

Skull: Calvarium is unremarkable.

Sinuses/Orbits: No acute finding.

Other: None.
IMPRESSION: No acute intracranial hemorrhage or definite acute infarction.

Age-indeterminate new small right thalamic infarct.

Chronic infarct of the left basal ganglia and adjacent white matter.

## 2021-04-16 MED ORDER — LABETALOL HCL 5 MG/ML IV SOLN
20.0000 mg | Freq: Once | INTRAVENOUS | Status: AC
  Administered 2021-04-16: 20 mg via INTRAVENOUS
  Filled 2021-04-16: qty 4

## 2021-04-16 MED ORDER — CLOPIDOGREL BISULFATE 75 MG PO TABS: 75.0000 mg | ORAL_TABLET | Freq: Every day | ORAL | 0 refills | Status: DC

## 2021-04-16 MED ORDER — LORAZEPAM 2 MG/ML IJ SOLN
1.0000 mg | Freq: Once | INTRAMUSCULAR | Status: AC
Start: 2021-04-16 — End: 2021-04-16
  Administered 2021-04-16: 1 mg via INTRAVENOUS
  Filled 2021-04-16: qty 1

## 2021-04-16 MED ORDER — CLOPIDOGREL BISULFATE 75 MG PO TABS
75.0000 mg | ORAL_TABLET | Freq: Once | ORAL | Status: AC
  Administered 2021-04-17: 75 mg via ORAL
  Filled 2021-04-16: qty 1

## 2021-04-16 NOTE — Progress Notes (Signed)
Lower extremity venous has been completed.  ? ?Preliminary results in CV Proc.  ? ?Xabi Wittler Qianna Clagett ?04/16/2021 5:08 PM    ?

## 2021-04-16 NOTE — ED Notes (Signed)
Patient transported to MRI 

## 2021-04-16 NOTE — ED Provider Triage Note (Signed)
Emergency Medicine Provider Triage Evaluation Note ? ?Jamie [name] , a 46 y.o. female  was evaluated in triage.  Pt complains of new onset, constant, since improved/resolved, left leg weakness that began around lunchtime today.  Patient states that she was at work when she felt like her leg felt weaker than normal.  She states that she was having difficulty ambulating due to same and decided to call 911.  When she gets to triage she states that her symptoms have completely resolved and she has been able to ambulate to the restroom without difficulty.  She does note that she has bilateral lower extremity edema however noticed that her left leg seemed more swollen than her right side today.  She has been off of her blood pressure medications for the past 2 weeks including lisinopril and HCTZ. ? ?Review of Systems  ?Positive: + left leg swelling, weakness ?Negative: - slurred speech, vision changes, L arm weakness ? ?Physical Exam  ?There were no vitals taken for this visit. ?Gen:   Awake, no distress   ?Resp:  Normal effort  ?MSK:   Moves extremities without difficulty  ?Other:  CN 2-12 intact. No drift. Speech clear. Strength 5/5 to BUE and BLEs. Sensation intact throughout. BLE swelling; worse on L side.  ? ?Medical Decision Making  ?Medically screening exam initiated at 4:32 PM.  Appropriate orders placed.  ATOYA ANDREW was informed that the remainder of the evaluation will be completed by another provider, this initial triage assessment does not replace that evaluation, and the importance of remaining in the ED until their evaluation is complete. ? ?Questionable TIA? Hx of CVA in the past. Symptoms have completely resolved at this time. Also noted to have BLE swelling however L > R. Noncomplaint with BP meds. BP 230/122 currently.  ?  ?Tanda Rockers, PA-C ?04/16/21 1641 ? ?

## 2021-04-16 NOTE — ED Provider Notes (Signed)
?  Provider Note ?MRN:  [id number]  ?Arrival date & time: 04/17/21    ?ED Course and Medical Decision Making  ?Assumed care from Dr. Rhunette Croft at shift change. ? ?Unilateral leg weakness question TIA versus stroke awaiting MRI.  Will need to discuss admission versus leaving AMA with patient. ? ?MR imaging is without acute stroke.  Discussed with patient, who is now interested in admission. ?Procedures ? ?Final Clinical Impressions(s) / ED Diagnoses  ? ?  ICD-10-CM   ?1. TIA (transient ischemic attack)  G45.9   ?  ?  ?ED Discharge Orders   ? ?      Ordered  ?  clopidogrel (PLAVIX) 75 MG tablet  Daily       ? 04/16/21 2319  ? ?  ?  ? ?  ?  ?Discharge Instructions   ?None ?  ? ? ?Elmer Sow. Pilar Plate, MD ?[facility] Emergency Medicine ?[facility] [email or web address] ? ?  ?Sabas Sous, MD ?04/17/21 0630 ? ?

## 2021-04-16 NOTE — ED Triage Notes (Signed)
Pt from work via EMS for eval of BLE swelling for a few days and LLE weakness today. Ambulatory. Hypertensive >200 systolic, as she stopped taking her blood pressure meds one week ago d/t inability to afford same.  ?

## 2021-04-16 NOTE — ED Notes (Signed)
Pt states that her symptoms have resolved and she was ready to go ?

## 2021-04-16 NOTE — ED Provider Notes (Signed)
?[facility] EMERGENCY DEPARTMENT ?Provider Note ? ? ?CSN: [id number] ?Arrival date & time: 04/16/21  1618 ? ?  ? ?History ? ?No chief complaint on file. ? ? ?Jamie [name] is a 46 y.o. female. ? ?HPI ? ?  ? ?Mrs. Catapano - Pt presents today with left foot weakness starting this afternoon at 1:15 (last known normal at 12:30 PM when she went for lunch).  ? ?Pt's PMH include CVA, hx of smoking for 10 years, and uncontrolled HTN. Pt states that today she was "eating lunch and I tried to stand up and my left foot would not work". Pt states that she has never experienced this before and eventually she was able to put weight on her foot to walk. She states that she has not had any trauma to her left foot in the last week and denies sitting on her foot during lunch. She does state that she has had muscle cramps in her legs "ever since she was put on the HCTZ" and stopped the HCTZ 2 weeks ago due to the cramps. Pt states that she has had bilateral leg edema and it has not changed since she was little. Denies fatigue, night sweats, fever, claudication when walking, pain in her lower extremities, pain when legs are dependent or elevated, numbness in her lower extremities, darkened skin in either one of her legs, headache, or blurry vision.  ? ?Home Medications ?Prior to Admission medications   ?Medication Sig Start Date End Date Taking? Authorizing Provider  ?clopidogrel (PLAVIX) 75 MG tablet Take 1 tablet (75 mg total) by mouth daily. 04/16/21  Yes Derwood Kaplan, MD  ?aspirin 81 MG EC tablet Take 1 tablet (81 mg total) by mouth daily. 12/18/18   Black, Lesle Chris, NP  ?atorvastatin (LIPITOR) 40 MG tablet Take 1 tablet (40 mg total) by mouth daily at 6 PM. 12/18/18   Black, Lesle Chris, NP  ?hydrochlorothiazide (HYDRODIURIL) 12.5 MG tablet Take 1 tablet (12.5 mg total) by mouth daily. 12/18/18   Black, Lesle Chris, NP  ?lisinopril (ZESTRIL) 20 MG tablet Take 1 tablet (20 mg total) by mouth daily. 12/18/18   Black, Lesle Chris, NP  ?nicotine (NICODERM CQ - DOSED IN MG/24 HOURS) 21 mg/24hr patch Place 1 patch (21 mg total) onto the skin daily. 12/18/18   Black, Lesle Chris, NP  ?   ? ?Allergies    ?Patient has no known allergies.   ? ?Review of Systems   ?Review of Systems ? ?Physical Exam ?Updated Vital Signs ?BP (!) 160/99 (BP Location: Right Arm)   Pulse 88   Temp 98.6 ?F (37 ?C) (Oral)   Resp 16   LMP  (LMP Unknown)   SpO2 100%  ?Physical Exam ? ?ED Results / Procedures / Treatments   ?Labs ?(all labs ordered are listed, but only abnormal results are displayed) ?Labs Reviewed  ?CBC - Abnormal; Notable for the following components:  ?    Result Value  ? Hemoglobin 10.2 (*)   ? HCT 34.3 (*)   ? MCV 76.4 (*)   ? MCH 22.7 (*)   ? MCHC 29.7 (*)   ? RDW 16.5 (*)   ? All other components within normal limits  ?COMPREHENSIVE METABOLIC PANEL - Abnormal; Notable for the following components:  ? Calcium 8.6 (*)   ? Albumin 3.4 (*)   ? All other components within normal limits  ?I-STAT BETA HCG BLOOD, ED ([facility], [facility], [facility] ONLY) - Abnormal; Notable for the following components:  ?  I-stat hCG, quantitative 7.7 (*)   ? All other components within normal limits  ?RESP PANEL BY RT-PCR (FLU A&B, COVID) [id number]  ?ETHANOL  ?PROTIME-INR  ?APTT  ?DIFFERENTIAL  ?BRAIN NATRIURETIC PEPTIDE  ?RAPID URINE DRUG SCREEN, HOSP PERFORMED  ?URINALYSIS, ROUTINE W REFLEX MICROSCOPIC  ?I-STAT CHEM 8, ED  ? ? ?EKG ?EKG Interpretation ? ?Date/Time:  Monday April 16 2021 17:52:44 EDT ?Ventricular Rate:  74 ?PR Interval:  142 ?QRS Duration: 78 ?QT Interval:  388 ?QTC Calculation: 430 ?R Axis:   67 ?Text Interpretation: Normal sinus rhythm Normal ECG When compared with ECG of 17-Dec-2018 18:24, Nonspecific ST and T wave abnormality is no longer present Confirmed by Dione Booze ([id number]) on 04/16/2021 11:35:59 PM ? ?Radiology ?DG Chest 2 View ? ?Result Date: 04/16/2021 ?CLINICAL DATA:  Evaluate for fluid overload. EXAM: CHEST - 2 VIEW COMPARISON:  AP chest 12/18/2018 FINDINGS:  Cardiac silhouette is at the upper limits of normal size. Mediastinal contours are within normal limits. The lungs are clear. No pleural effusion or pneumothorax. No acute skeletal abnormality. IMPRESSION: No active cardiopulmonary disease. Electronically Signed   By: Neita Garnet M.D.   On: 04/16/2021 17:47  ? ?CT HEAD WO CONTRAST ? ?Result Date: 04/16/2021 ?CLINICAL DATA:  Transient ischemic attack (TIA) EXAM: CT HEAD WITHOUT CONTRAST TECHNIQUE: Contiguous axial images were obtained from the base of the skull through the vertex without intravenous contrast. RADIATION DOSE REDUCTION: This exam was performed according to the departmental dose-optimization program which includes automated exposure control, adjustment of the mA and/or kV according to patient size and/or use of iterative reconstruction technique. COMPARISON:  2020 FINDINGS: Brain: There is no acute intracranial hemorrhage, mass effect, or edema. Dolberry-white differentiation is preserved. Chronic infarct of the left basal ganglia and adjacent white matter. New age-indeterminate small right thalamic infarct. There is no extra-axial fluid collection. Ventricles and sulci are within normal limits in size and configuration. Vascular: No hyperdense vessel. Skull: Calvarium is unremarkable. Sinuses/Orbits: No acute finding. Other: None. IMPRESSION: No acute intracranial hemorrhage or definite acute infarction. Age-indeterminate new small right thalamic infarct. Chronic infarct of the left basal ganglia and adjacent white matter. Electronically Signed   By: Guadlupe Spanish M.D.   On: 04/16/2021 18:01  ? ?VAS [place] LOWER EXTREMITY VENOUS (DVT) (7a-7p) ? ?Result Date: 04/16/2021 ? Lower Venous DVT Study Patient Name:  Jamie [name]  Date of Exam:   04/16/2021 Medical Rec #: [id number]        Accession #:    [id number] Date of Birth: [birth year]       Patient Gender: F Patient Age:   21 years Exam Location:  [facility] Procedure:      VAS [place] LOWER EXTREMITY  VENOUS (DVT) Referring Phys: MARGAUX VENTER --------------------------------------------------------------------------------  Indications: Swelling, and Edema.  Comparison Study: no prior Performing Technologist: [place] Ponder RVS  Examination Guidelines: A complete evaluation includes B-mode imaging, spectral Doppler, color Doppler, and power Doppler as needed of all accessible portions of each vessel. Bilateral testing is considered an integral part of a complete examination. Limited examinations for reoccurring indications may be performed as noted. The reflux portion of the exam is performed with the patient in reverse Trendelenburg.  +-----+---------------+---------+-----------+----------+--------------+ RIGHTCompressibilityPhasicitySpontaneityPropertiesThrombus Aging +-----+---------------+---------+-----------+----------+--------------+ CFV  Full           Yes      Yes                                 +-----+---------------+---------+-----------+----------+--------------+   +---------+---------------+---------+-----------+----------+--------------+  LEFT     CompressibilityPhasicitySpontaneityPropertiesThrombus Aging +---------+---------------+---------+-----------+----------+--------------+ CFV      Full           Yes                                          +---------+---------------+---------+-----------+----------+--------------+ SFJ      Full                                                        +---------+---------------+---------+-----------+----------+--------------+ FV Prox  Full                                                        +---------+---------------+---------+-----------+----------+--------------+ FV Mid   Full                                                        +---------+---------------+---------+-----------+----------+--------------+ FV DistalFull                                                         +---------+---------------+---------+-----------+----------+--------------+ PFV      Full                                                        +---------+---------------+---------+-----------+----------+--------------+ POP      Full           Yes      Yes                                 +------

## 2021-04-16 NOTE — Consult Note (Signed)
Neurology Consultation ?Reason for Consult: Left leg weakness ?Referring Physician: Kathrynn Humble, A 82 ? ?CC: Left leg weakness ? ?History is obtained from: Patient ? ?HPI: Jamie [name] is a 46 y.o. female with a history of previous stroke, uncontrolled hypertension who stopped taking her antihypertensives about a week ago.  She states that she typically gets her medications with Medicaid, but it lapsed temporarily and so she assumed she would not be able to afford her medicines.  I did indicate that both lisinopril and hydrochlorothiazide are typically extremely cheap.  ? ?Today she was in her normal state of health at work when she suddenly noticed that her left leg seem to go out.  She states that it was weak for approximately 2 hours, but has since improved and she feels like he is back to baseline currently. ? ? ?LKW: 12:30 PM ?tpa given?: no, resolution of symptoms ?NIHSS: 0 ? ?ROS: A 14 point ROS was performed and is negative except as noted in the HPI.  ?Past Medical History:  ?Diagnosis Date  ? Hypertension   ? Stroke [facility])   ? Tobacco use   ? ? ? ?Family History  ?Problem Relation Age of Onset  ? Hypertension Mother   ? Diabetes Mother   ? ? ? ?Social History:  reports that she has been smoking cigarettes. She has been smoking an average of .5 packs per day. She has never used smokeless tobacco. She reports current drug use. Drug: Marijuana. She reports that she does not drink alcohol. ? ? ?Exam: ?Current vital signs: ?BP (!) 160/99 (BP Location: Right Arm)   Pulse 88   Temp 98.6 ?F (37 ?C) (Oral)   Resp 16   LMP  (LMP Unknown)   SpO2 100%  ?Vital signs in last 24 hours: ?Temp:  [98.6 ?F (37 ?C)] 98.6 ?F (37 ?C) (04/10 1632) ?Pulse Rate:  [id number] (04/10 2114) ?Resp:  [16-18] 16 (04/10 2114) ?BP: (160-230)/(85-122) 160/99 (04/10 2114) ?SpO2:  [99 %-100 %] 100 % (04/10 2114) ? ? ?Physical Exam  ?Constitutional: Appears well-developed and well-nourished.  ?Psych: Affect appropriate to situation ?Eyes:  No scleral injection ?HENT: No OP obstruction ?MSK: no joint deformities.  ?Cardiovascular: Normal rate and regular rhythm.  ?Respiratory: Effort normal, non-labored breathing ?GI: Soft.  No distension. There is no tenderness.  ?Skin: WDI ? ?Neuro: ?Mental Status: ?Patient is awake, alert, oriented to person, place, month, year, and situation. ?Patient is able to give a clear and coherent history. ?No signs of aphasia or neglect ?Cranial Nerves: ?II: Visual Fields are full. Pupils are equal, round, and reactive to light.   ?III,IV, VI: EOMI without ptosis or diploplia.  ?V: Facial sensation is symmetric to temperature ?VII: Facial movement is symmetric.  ?VIII: hearing is intact to voice ?X: Uvula elevates symmetrically ?XI: Shoulder shrug is symmetric. ?XII: tongue is midline without atrophy or fasciculations.  ?Motor: ?Tone is normal. Bulk is normal. 5/5 strength was present in all four extremities.  ?Sensory: ?Sensation is symmetric to light touch and temperature in the arms and legs. ?Cerebellar: ?FNF intact bilaterally ? ? ? ? ? ?I have reviewed labs in epic and the results pertinent to this consultation are: ?Creatinine 0.9 ?Sodium 136 ? ?I have reviewed the images obtained: CT head-negative ? ?Impression: 46 year old female with transient left leg painless weakness.  Given that it seemed to involve the entire leg, unlikely to be a peripheral issue.  Given that it was unilateral unlikely to be a spinal issue.  With  no pain, unlikely to be ischemic.  Therefore I think the most likely etiology would be a transient ischemic attack.  I advised possible admission, but the patient is extremely resistant to it.  I did ask her to at least let us do some vascular imaging to assess her risk. ? ?Recommendations: ?1) resume high intensity statin with atorvastatin 40 mg daily ?2) resume blood pressure medications if MRI is negative ?3) MRI brain, MRA head and neck ?4) telemetry and echo if patient agrees to admission,  otherwise this can be done as an outpatient.  ?5) ASA 81 mg and plavix 75mg  daily for three weeks followed by asa monotherapy.  ? ? ?Roland Rack, MD ?[facility] ?[phone] ? ?If 7pm- 7am, please page neurology on call as listed in [place]. ? ?

## 2021-04-17 ENCOUNTER — Other Ambulatory Visit (HOSPITAL_COMMUNITY): Payer: Self-pay

## 2021-04-17 ENCOUNTER — Observation Stay (HOSPITAL_BASED_OUTPATIENT_CLINIC_OR_DEPARTMENT_OTHER): Payer: Medicaid Other

## 2021-04-17 DIAGNOSIS — E785 Hyperlipidemia, unspecified: Secondary | ICD-10-CM

## 2021-04-17 DIAGNOSIS — I160 Hypertensive urgency: Secondary | ICD-10-CM

## 2021-04-17 DIAGNOSIS — G459 Transient cerebral ischemic attack, unspecified: Secondary | ICD-10-CM | POA: Diagnosis not present

## 2021-04-17 DIAGNOSIS — D509 Iron deficiency anemia, unspecified: Secondary | ICD-10-CM

## 2021-04-17 DIAGNOSIS — R350 Frequency of micturition: Secondary | ICD-10-CM

## 2021-04-17 DIAGNOSIS — Z8673 Personal history of transient ischemic attack (TIA), and cerebral infarction without residual deficits: Secondary | ICD-10-CM | POA: Diagnosis not present

## 2021-04-17 DIAGNOSIS — Z720 Tobacco use: Secondary | ICD-10-CM

## 2021-04-17 DIAGNOSIS — R600 Localized edema: Secondary | ICD-10-CM

## 2021-04-17 DIAGNOSIS — N3000 Acute cystitis without hematuria: Secondary | ICD-10-CM

## 2021-04-17 LAB — BASIC METABOLIC PANEL
Anion gap: 4 — ABNORMAL LOW (ref 5–15)
BUN: 8 mg/dL (ref 6–20)
CO2: 25 mmol/L (ref 22–32)
Calcium: 8.6 mg/dL — ABNORMAL LOW (ref 8.9–10.3)
Chloride: 108 mmol/L (ref 98–111)
Creatinine, Ser: 0.99 mg/dL (ref 0.44–1.00)
GFR, Estimated: >60 mL/min (ref >60)
Glucose, Bld: 100 mg/dL — ABNORMAL HIGH (ref 70–99)
Potassium: 3.8 mmol/L (ref 3.5–5.1)
Sodium: 137 mmol/L (ref 135–145)

## 2021-04-17 LAB — PREGNANCY, URINE: Preg Test, Ur: NEGATIVE

## 2021-04-17 LAB — URINALYSIS, ROUTINE W REFLEX MICROSCOPIC
Bilirubin Urine: NEGATIVE
Glucose, UA: NEGATIVE mg/dL
Hgb urine dipstick: NEGATIVE
Ketones, ur: NEGATIVE mg/dL
Leukocytes,Ua: NEGATIVE
Nitrite: POSITIVE — AB
Protein, ur: NEGATIVE mg/dL
Specific Gravity, Urine: 1.013 (ref 1.005–1.030)
pH: 7 (ref 5.0–8.0)

## 2021-04-17 LAB — ECHOCARDIOGRAM COMPLETE BUBBLE STUDY
Area-P 1/2: 3.83 cm2
S' Lateral: 3.3 cm

## 2021-04-17 LAB — HEMOGLOBIN A1C
Hgb A1c MFr Bld: 5.2 % (ref 4.8–5.6)
Mean Plasma Glucose: 102.54 mg/dL

## 2021-04-17 LAB — RAPID URINE DRUG SCREEN, HOSP PERFORMED
Amphetamines: NOT DETECTED
Barbiturates: NOT DETECTED
Benzodiazepines: NOT DETECTED
Cocaine: NOT DETECTED
Opiates: NOT DETECTED
Tetrahydrocannabinol: POSITIVE — AB

## 2021-04-17 LAB — LIPID PANEL
Cholesterol: 192 mg/dL (ref 0–200)
HDL: 47 mg/dL (ref >40)
LDL Cholesterol: 132 mg/dL — ABNORMAL HIGH (ref 0–99)
Total CHOL/HDL Ratio: 4.1 RATIO
Triglycerides: 66 mg/dL (ref <150)
VLDL: 13 mg/dL (ref 0–40)

## 2021-04-17 LAB — RESP PANEL BY RT-PCR (FLU A&B, COVID) ARPGX2
Influenza A by PCR: NEGATIVE
Influenza B by PCR: NEGATIVE
SARS Coronavirus 2 by RT PCR: NEGATIVE

## 2021-04-17 LAB — HIV ANTIBODY (ROUTINE TESTING W REFLEX): HIV Screen 4th Generation wRfx: NONREACTIVE

## 2021-04-17 MED ORDER — SODIUM CHLORIDE 0.9 % IV SOLN: INTRAVENOUS | Status: DC

## 2021-04-17 MED ORDER — CLOPIDOGREL BISULFATE 75 MG PO TABS
75.0000 mg | ORAL_TABLET | Freq: Every day | ORAL | 0 refills | Status: AC
  Filled 2021-04-17: qty 21, 21d supply, fill #0

## 2021-04-17 MED ORDER — ACETAMINOPHEN 325 MG PO TABS: 650.0000 mg | ORAL_TABLET | ORAL | Status: DC | PRN

## 2021-04-17 MED ORDER — ASPIRIN 81 MG PO TBEC
81.0000 mg | DELAYED_RELEASE_TABLET | Freq: Every day | ORAL | 0 refills | Status: AC
  Filled 2021-04-17: qty 30, 30d supply, fill #0

## 2021-04-17 MED ORDER — ONDANSETRON HCL 4 MG/2ML IJ SOLN: 4.0000 mg | INTRAMUSCULAR | Status: DC | PRN

## 2021-04-17 MED ORDER — LISINOPRIL 20 MG PO TABS
20.0000 mg | ORAL_TABLET | Freq: Every day | ORAL | 0 refills | Status: AC
  Filled 2021-04-17: qty 30, 30d supply, fill #0

## 2021-04-17 MED ORDER — ATORVASTATIN CALCIUM 40 MG PO TABS
40.0000 mg | ORAL_TABLET | Freq: Every day | ORAL | 0 refills | Status: AC
Start: 2021-04-17 — End: ?
  Filled 2021-04-17: qty 30, 30d supply, fill #0

## 2021-04-17 MED ORDER — NICOTINE 21 MG/24HR TD PT24
21.0000 mg | MEDICATED_PATCH | Freq: Every day | TRANSDERMAL | 0 refills | Status: AC
  Filled 2021-04-17: qty 28, 28d supply, fill #0

## 2021-04-17 MED ORDER — LISINOPRIL 20 MG PO TABS
20.0000 mg | ORAL_TABLET | Freq: Every day | ORAL | Status: DC
  Administered 2021-04-17: 20 mg via ORAL
  Filled 2021-04-17: qty 1

## 2021-04-17 MED ORDER — MAGNESIUM HYDROXIDE 400 MG/5ML PO SUSP: 30.0000 mL | Freq: Every day | ORAL | Status: DC | PRN

## 2021-04-17 MED ORDER — ASPIRIN EC 81 MG PO TBEC
81.0000 mg | DELAYED_RELEASE_TABLET | Freq: Every day | ORAL | Status: DC
  Administered 2021-04-17: 81 mg via ORAL
  Filled 2021-04-17: qty 1

## 2021-04-17 MED ORDER — SENNOSIDES-DOCUSATE SODIUM 8.6-50 MG PO TABS: 1.0000 | ORAL_TABLET | Freq: Every evening | ORAL | Status: DC | PRN

## 2021-04-17 MED ORDER — ATORVASTATIN CALCIUM 40 MG PO TABS: 80.0000 mg | ORAL_TABLET | Freq: Every day | ORAL | Status: DC

## 2021-04-17 MED ORDER — HYDROCHLOROTHIAZIDE 12.5 MG PO TABS
12.5000 mg | ORAL_TABLET | Freq: Every day | ORAL | 0 refills | Status: AC
  Filled 2021-04-17: qty 30, 30d supply, fill #0

## 2021-04-17 MED ORDER — HYDROCHLOROTHIAZIDE 12.5 MG PO TABS
12.5000 mg | ORAL_TABLET | Freq: Every day | ORAL | Status: DC
  Administered 2021-04-17: 12.5 mg via ORAL
  Filled 2021-04-17: qty 1

## 2021-04-17 MED ORDER — ATORVASTATIN CALCIUM 40 MG PO TABS: 40.0000 mg | ORAL_TABLET | Freq: Every day | ORAL | Status: DC

## 2021-04-17 MED ORDER — TRAZODONE HCL 50 MG PO TABS: 25.0000 mg | ORAL_TABLET | Freq: Every evening | ORAL | Status: DC | PRN

## 2021-04-17 MED ORDER — FOSFOMYCIN TROMETHAMINE 3 G PO PACK
3.0000 g | PACK | Freq: Once | ORAL | Status: AC
  Administered 2021-04-17: 3 g via ORAL
  Filled 2021-04-17: qty 3

## 2021-04-17 MED ORDER — STROKE: EARLY STAGES OF RECOVERY BOOK
Freq: Once | Status: AC
  Filled 2021-04-17: qty 1

## 2021-04-17 MED ORDER — HYDRALAZINE HCL 20 MG/ML IJ SOLN: 10.0000 mg | INTRAMUSCULAR | Status: DC | PRN

## 2021-04-17 MED ORDER — CLOPIDOGREL BISULFATE 75 MG PO TABS: 75.0000 mg | ORAL_TABLET | Freq: Every day | ORAL | Status: DC

## 2021-04-17 MED ORDER — ENOXAPARIN SODIUM 40 MG/0.4ML IJ SOSY: 40.0000 mg | PREFILLED_SYRINGE | Freq: Every day | INTRAMUSCULAR | Status: DC

## 2021-04-17 NOTE — Assessment & Plan Note (Addendum)
Patient presented with transient left leg weakness.  She had not been a thrombolytic candidate due to resolution of symptoms. MRI along with MRA of the head and neck did not show any signs of a acute stroke or large vessel occlusion.  Plan was admission for completion of stroke work-up.  Patient was started on aspirin and Plavix ?-Admit to a telemetry bed ?-Stroke order set utilized ?-Neurochecks ?-Check lipid panel and hemoglobin A1c ?-Check echocardiogram ?-PT/OT/speech ?-Aspirin, Plavix, statin ?-Appreciate neurology consultative services, we will follow-up for any further recommendations ?

## 2021-04-17 NOTE — Assessment & Plan Note (Signed)
Patient reports previously having a stroke in 2019, but denies having any residual deficits. ?-Aspirin, statin, Plavix ?

## 2021-04-17 NOTE — Evaluation (Signed)
Physical Therapy Evaluation ?Patient Details ?Name: Jamie [name] ?MRN: [id number] ?DOB: 1975-05-12 ?Today's Date: 04/17/2021 ? ?History of Present Illness ? Pt is a 46 y.o female presenting to the ED 04/16/21 with left leg weakness. MRI negative. PMH includes L CVA, HTN, urinary incontinence for ~10yr, and tobacco use.  ?Clinical Impression ? Patient presented to the ED on 04/16/21 with left leg weakness and high BP consistent with patient's diagnosis of TIA. Pt's impairments include decreased left lower extremity strength, balance, activity tolerance, and knowledge of use of DME. These impairments limiting her ability to safely and independently transfer, ambulate, and navigate stairs. Patient is mod I with sitting at EOB and requires supervision for sit to supine. She requires min A for steadying with transfers and ambulation using 1 person HHA. Pt with increased fatigue after short ambulation distance. Pt educated on "BE FAST" acronym and using a cane initially for safety at home. SPT recommending outpatient physical therapy upon D/C due to mobility deficits. PT will continue to follow acutely to maximize pt's safety and independence with functional mobility. ?   ?   ? ?Recommendations for follow up therapy are one component of a multi-disciplinary discharge planning process, led by the attending physician.  Recommendations may be updated based on patient status, additional functional criteria and insurance authorization. ? ?Follow Up Recommendations Outpatient PT ? ?  ?Assistance Recommended at Discharge Intermittent Supervision/Assistance  ?Patient can return home with the following ? A little help with bathing/dressing/bathroom;Assistance with cooking/housework;Assist for transportation;Help with stairs or ramp for entrance ? ?  ?Equipment Recommendations [place] [facility]  ?Recommendations for Other Services ?    ?  ?Functional Status Assessment Patient has had a recent decline in their functional status and demonstrates  the ability to make significant improvements in function in a reasonable and predictable amount of time.  ? ?  ?Precautions / Restrictions Precautions ?Precautions: Fall ?Restrictions ?Weight Bearing Restrictions: No  ? ?  ? ?Mobility ? Bed Mobility ?Overal bed mobility: Needs Assistance ?Bed Mobility: Sit to Supine ?  ?  ?  ?Sit to supine: Supervision ?  ?General bed mobility comments: pt presents in long sitting position on ED stretcher upon arrival. Pt requires increased time to sit at EOB. Pt did require supervision for safety with sit to supine transition and cues for scooting hips over so her left leg was not hanging over the edge of the stretcher. ?  ? ?Transfers ?Overall transfer level: Needs assistance ?Equipment used: 1 person hand held assist ?Transfers: Sit to/from Stand ?Sit to Stand: Min assist, From elevated surface ?  ?  ?  ?  ?  ?General transfer comment: pt required min A for steadying during transfer with 1 person HHA. Pt initially reaching for tray table for UE assist during transfer, however, was offered to use SPT hand instead for safety. ?  ? ?Ambulation/Gait ?Ambulation/Gait assistance: Min assist ?Gait Distance (Feet): 20 Feet ?Assistive device: 1 person hand held assist ?Gait Pattern/deviations: Decreased stride length ?Gait velocity: decreased ?Gait velocity interpretation: 1.31 - 2.62 ft/sec, indicative of limited community ambulator ?  ?General Gait Details: pt required min A for steadying with 1 person HHA during ambulation. Pt with slow, cautious gait and decreased foot clearance on left. No overt LOB, however, pt turns very slowly and with lots of steps to walk back towards hallway stretcher in ED. Pt fatigued easily with short ambulation distance ? ?Stairs ?  ?  ?  ?  ?  ? ?Wheelchair Mobility ?  ? ?  Modified Rankin (Stroke Patients Only) ?  ? ?  ? ?Balance Overall balance assessment: Needs assistance ?Sitting-balance support: Single extremity supported, Feet unsupported ?Sitting  balance-Leahy Scale: Fair ?Sitting balance - Comments: pt used single UE support in sitting, however, not reliant on UE support. Pt able to donn socks in long sitting and shift weight in sitting for LE strength testing without LOB and min guard A for safety. ?  ?Standing balance support: Single extremity supported, During functional activity ?Standing balance-Leahy Scale: Poor ?Standing balance comment: pt required min A for steadying in standing with 1 person HHA for UE support for balance. Pt reports her left leg feels heavier and more wobbly in standing compared to baseline. ?  ?  ?  ?  ?  ?  ?  ?  ?  ?  ?  ?   ? ? ? ?Pertinent Vitals/Pain Pain Assessment ?Pain Assessment: Faces ?Faces Pain Scale: No hurt  ? ? ?Home Living Family/patient expects to be discharged to:: Private residence ?Living Arrangements: Children ?Available Help at Discharge: Family;Available 24 hours/day ?Type of Home: Apartment ?Home Access: Level entry ?  ?  ?Alternate Level Stairs-Number of Steps: 14 ?Home Layout: Two level ?Home Equipment: Grab bars - tub/shower ?   ?  ?Prior Function Prior Level of Function : Independent/Modified Independent;Driving ?  ?  ?  ?  ?  ?  ?Mobility Comments: ambulated without AD ?ADLs Comments: able to bathe, dress, feed herself and perform all ADLs ?  ? ? ?Hand Dominance  ? Dominant Hand: Right ? ?  ?Extremity/Trunk Assessment  ? Upper Extremity Assessment ?Upper Extremity Assessment: Overall WFL for tasks assessed ?  ? ?Lower Extremity Assessment ?Lower Extremity Assessment: LLE deficits/detail ?LLE Deficits / Details: Pt candid about still experiencing weakness in her left leg but really wants to go home. Pt was 4-/5 MMT for knee extensors and ankle DF. Pt 4/5 MMT hip flexion. ?LLE Coordination: WNL ?  ? ?Cervical / Trunk Assessment ?Cervical / Trunk Assessment: Normal  ?Communication  ? Communication: No difficulties  ?Cognition Arousal/Alertness: Awake/alert ?Behavior During Therapy: [facility] for tasks  assessed/performed ?Overall Cognitive Status: Within Functional Limits for tasks assessed ?  ?  ?  ?  ?  ?  ?  ?  ?  ?  ?  ?  ?  ?  ?  ?  ?  ?  ?  ? ?  ?General Comments   ? ?  ?Exercises    ? ?Assessment/Plan  ?  ?PT Assessment Patient needs continued PT services  ?PT Problem List Decreased strength;Decreased activity tolerance;Decreased balance;Decreased knowledge of use of DME ? ?   ?  ?PT Treatment Interventions DME instruction;Gait training;Stair training;Functional mobility training;Therapeutic activities;Therapeutic exercise;Balance training;Neuromuscular re-education;Patient/family education   ? ?PT Goals (Current goals can be found in the Care Plan section)  ?Acute Rehab PT Goals ?Patient Stated Goal: to go home ?PT Goal Formulation: With patient ?Time For Goal Achievement: 05/01/21 ?Potential to Achieve Goals: Good ? ?  ?Frequency Min 3X/week ?  ? ? ?Co-evaluation   ?  ?  ?  ?  ? ? ?  ?AM-PAC PT "6 Clicks" Mobility  ?Outcome Measure Help needed turning from your back to your side while in a flat bed without using bedrails?: None ?Help needed moving from lying on your back to sitting on the side of a flat bed without using bedrails?: None ?Help needed moving to and from a bed to a chair (including a wheelchair)?: A Little ?  Help needed standing up from a chair using your arms (e.g., wheelchair or bedside chair)?: A Little ?Help needed to walk in hospital room?: A Little ?Help needed climbing 3-5 steps with a railing? : A Lot ?6 Click Score: 19 ? ?  ?End of Session Equipment Utilized During Treatment: Gait belt ?Activity Tolerance: Patient tolerated treatment well ?Patient left: in bed;Other (comment) (on stretcher in ED hallway) ?Nurse Communication: Mobility status ?PT Visit Diagnosis: Unsteadiness on feet (R26.81);Other abnormalities of gait and mobility (R26.89);Muscle weakness (generalized) (M62.81) ?  ? ?Time: [id number] ?PT Time Calculation (min) (ACUTE ONLY): 25 min ? ? ?Charges:   PT Evaluation ?$PT  Eval Low Complexity: 1 Low ?PT Treatments ?$Gait Training: 8-22 mins ?  ?   ? ? ?Enis SlipperIsabel Legacy [place], SPT ? ?Enis Slippersabel Laurance Heide ?04/17/2021, 12:36 PM ? ?

## 2021-04-17 NOTE — Assessment & Plan Note (Signed)
Acute on chronic.  Patient reports chronically having lower extremity swelling.  Doppler ultrasound of the lower extremities was negative for any signs of a DVT. ?-Recommended patient to elevate legs at night and suggest use of compression stockings ?

## 2021-04-17 NOTE — Discharge Planning (Signed)
RNCM consulted regarding uninsured pt requiring discharge Rx.  [facility] will deliver Rx to patient at bedside prior to discharge from hospital.  ? ?

## 2021-04-17 NOTE — Evaluation (Signed)
Occupational Therapy Evaluation ?Patient Details ?Name: Jamie [name] ?MRN: [id number] ?DOB: [birth year] ?Today's Date: 04/17/2021 ? ? ?History of Present Illness Pt is a 46 y.o female presenting to the ED 04/16/21 with left leg weakness. MRI negative. PMH includes L CVA, HTN, urinary incontinence for ~53yr, and tobacco use.  ? ?Clinical Impression ?  ?Educated pt on strategies to reduce risk of falls during ADL and mobility. Pt states she will have assistance at home. No follow up OT needed. OT signing off.   ?   ? ?Recommendations for follow up therapy are one component of a multi-disciplinary discharge planning process, led by the attending physician.  Recommendations may be updated based on patient status, additional functional criteria and insurance authorization.  ? ?Follow Up Recommendations ? No OT follow up  ?  ?Assistance Recommended at Discharge Set up Supervision/Assistance  ?Patient can return home with the following A little help with bathing/dressing/bathroom;Assistance with cooking/housework;Help with stairs or ramp for entrance ? ?  ?Functional Status Assessment ?  (not following)  ?Equipment Recommendations ? None recommended by OT  ?  ?Recommendations for Other Services   ? ? ?  ?Precautions / Restrictions Precautions ?Precautions: Fall ?Restrictions ?Weight Bearing Restrictions: No  ? ?  ? ?Mobility Bed Mobility ?Overal bed mobility: Needs Assistance ?Bed Mobility: Sit to Supine ?  ?  ?  ?Sit to supine: Supervision ?  ?General bed mobility comments: pt presents in long sitting position on ED stretcher upon arrival. Pt requires increased time to sit at EOB. Pt did require supervision for safety with sit to supine transition and cues for scooting hips over so her left leg was not hanging over the edge of the stretcher. ?  ? ?Transfers ?Overall transfer level: Needs assistance ?Equipment used: 1 person hand held assist ?Transfers: Sit to/from Stand ?Sit to Stand: From elevated surface, Supervision ?  ?   ?  ?  ?  ?General transfer comment: pt required min A for steadying during transfer with 1 person HHA. Pt initially reaching for tray table for UE assist during transfer, however, was offered to use SPT hand instead for safety. ?  ? ?  ?Balance Overall balance assessment: Needs assistance ?Sitting-balance support: Single extremity supported, Feet unsupported ?Sitting balance-Leahy Scale: Fair ?Sitting balance - Comments: pt used single UE support in sitting, however, not reliant on UE support. Pt able to donn socks in long sitting and shift weight in sitting for LE strength testing without LOB and min guard A for safety. ?  ?Standing balance support: Single extremity supported, During functional activity ?Standing balance-Leahy Scale: Poor ?Standing balance comment: pt required min A for steadying in standing with 1 person HHA for UE support for balance. Pt reports her left leg feels heavier and more wobbly in standing compared to baseline. ?  ?  ?  ?  ?  ?  ?  ?  ?  ?  ?  ?   ? ?ADL either performed or assessed with clinical judgement  ? ?ADL Overall ADL's : Needs assistance/impaired ?  ?  ?  ?  ?  ?  ?  ?  ?  ?  ?  ?  ?  ?  ?  ?  ?  ?  ?Functional mobility during ADLs: Supervision/safety ?General ADL Comments: able to complete ADL tasks with increased time; educated on strategies to reduce risk of falls; encouraged ot use tub chair and have assistance with tub trasnfers; pt verbalized understanding  ? ? ? ?  Vision Baseline Vision/History: 0 No visual deficits ?   ?   ?Perception   ?  ?Praxis   ?  ? ?Pertinent Vitals/Pain Pain Assessment ?Faces Pain Scale: No hurt  ? ? ? ?Hand Dominance Right ?  ?Extremity/Trunk Assessment Upper Extremity Assessment ?Upper Extremity Assessment: Overall WFL for tasks assessed ?  ?Lower Extremity Assessment ?Lower Extremity Assessment: Defer to PT evaluation ?LLE Deficits / Details: Pt candid about still experiencing weakness in her left leg but really wants to go home. Pt was 4-/5  MMT for knee extensors and ankle DF. Pt 4/5 MMT hip flexion. ?LLE Coordination: WNL ?  ?Cervical / Trunk Assessment ?Cervical / Trunk Assessment: Normal ?  ?Communication Communication ?Communication: No difficulties ?  ?Cognition Arousal/Alertness: Awake/alert ?Behavior During Therapy: [facility] for tasks assessed/performed ?Overall Cognitive Status: Within Functional Limits for tasks assessed ?  ?  ?  ?  ?  ?  ?  ?  ?  ?  ?  ?  ?  ?  ?  ?  ?  ?  ?  ?General Comments    ? ?  ?Exercises   ?  ?Shoulder Instructions    ? ? ?Home Living Family/patient expects to be discharged to:: Private residence ?Living Arrangements: Children ?Available Help at Discharge: Family;Available 24 hours/day ?Type of Home: Apartment ?Home Access: Level entry ?  ?  ?Home Layout: Two level ?Alternate Level Stairs-Number of Steps: 14 ?Alternate Level Stairs-Rails: Right ?Bathroom Shower/Tub: Tub/shower unit ?  ?Bathroom Toilet: Standard ?  ?  ?Home Equipment: Grab bars - tub/shower ?  ?  ?  ? ?  ?Prior Functioning/Environment Prior Level of Function : Independent/Modified Independent;Driving ?  ?  ?  ?  ?  ?  ?Mobility Comments: ambulated without AD ?ADLs Comments: able to bathe, dress, feed herself and perform all ADLs ?  ? ?  ?  ?OT Problem List: Impaired balance (sitting and/or standing) ?  ?   ?OT Treatment/Interventions:    ?  ?OT Goals(Current goals can be found in the care plan section) Acute Rehab OT Goals ?Patient Stated Goal: to go home and get to work ?OT Goal Formulation: All assessment and education complete, DC therapy  ?OT Frequency:   ?  ? ?Co-evaluation   ?  ?  ?  ?  ? ?  ?AM-PAC OT "6 Clicks" Daily Activity     ?Outcome Measure Help from another person eating meals?: None ?Help from another person taking care of personal grooming?: None ?Help from another person toileting, which includes using toliet, bedpan, or urinal?: A Little ?Help from another person bathing (including washing, rinsing, drying)?: A Little ?Help from another  person to put on and taking off regular upper body clothing?: None ?Help from another person to put on and taking off regular lower body clothing?: A Little ?6 Click Score: 21 ?  ?End of Session Equipment Utilized During Treatment: Gait belt ? ?Activity Tolerance: Patient tolerated treatment well ?Patient left: in bed ? ?OT Visit Diagnosis: Other abnormalities of gait and mobility (R26.89)  ?              ?Time: 4332-9518 ?OT Time Calculation (min): 20 min ?Charges:  OT General Charges ?$OT Visit: 1 Visit ?OT Evaluation ?$OT Eval Low Complexity: 1 Low ? ?[facility], OT/L  ? ?Acute OT Clinical Specialist ?Acute Rehabilitation Services ?Pager [phone] ?Office [phone]  ? ?Jamie [name],Jamie [name] ?04/17/2021, 2:53 PM ?

## 2021-04-17 NOTE — Assessment & Plan Note (Addendum)
Urinalysis was positive for nitrites and many bacteria. ?-Check urine culture ?-Fosfomycin 1 g IV ?

## 2021-04-17 NOTE — Progress Notes (Addendum)
STROKE TEAM PROGRESS NOTE  ? ?INTERVAL HISTORY ?Patient is seen in the ED with no family at the bedside.  She reports that yesterday, while she was at work, she had an episode of being unable to walk because her left lower leg would not move correctly.  She denies having any issues with her spine and states that she had no other symptoms at that time.  These symptoms resolved after about two hours.  MRI brain revealed no acute abnormalities. Of note, she had not taken her antihypertensives for about a week due to a loss of insurance coverage.   ?She denies any back pain ,radicular pain ,numbness or lack of sensation. ?Vitals:  ? 04/17/21 0115 04/17/21 0435 04/17/21 0957 04/17/21 1001  ?BP: (!) 180/100 (!) 157/86 (!) 172/99 (!) 172/99  ?Pulse: 88 88  82  ?Resp: 16 17  18   ?Temp:  98.1 ?F (36.7 ?C)  98.1 ?F (36.7 ?C)  ?TempSrc:  Oral  Oral  ?SpO2: 99% 99%  99%  ? ?CBC:  ?Recent Labs  ?Lab 04/16/21 ?1644 04/16/21 ?1751  ?WBC 9.9  --   ?NEUTROABS 6.1  --   ?HGB 10.2* 12.6  ?HCT 34.3* 37.0  ?MCV 76.4*  --   ?PLT 326  --   ? ?Basic Metabolic Panel:  ?Recent Labs  ?Lab 04/16/21 ?1644 04/16/21 ?1751 04/17/21 ?0830  ?NA 136 140 137  ?K 3.6 3.7 3.8  ?CL 104 104 108  ?CO2 26  --  25  ?GLUCOSE 94 84 100*  ?BUN 11 12 8   ?CREATININE 0.97 0.90 0.99  ?CALCIUM 8.6*  --  8.6*  ? ?Lipid Panel:  ?Recent Labs  ?Lab 04/17/21 ?0830  ?CHOL 192  ?TRIG 66  ?HDL 47  ?CHOLHDL 4.1  ?VLDL 13  ?LDLCALC 132*  ? ?HgbA1c:  ?Recent Labs  ?Lab 04/17/21 ?0830  ?HGBA1C 5.2  ? ?Urine Drug Screen:  ?Recent Labs  ?Lab 04/17/21 ?0440  ?LABOPIA NONE DETECTED  ?COCAINSCRNUR NONE DETECTED  ?LABBENZ NONE DETECTED  ?AMPHETMU NONE DETECTED  ?THCU POSITIVE*  ?LABBARB NONE DETECTED  ?  ?Alcohol Level  ?Recent Labs  ?Lab 04/16/21 ?1644  ?ETH <10  ? ? ?IMAGING past 24 hours ?DG Chest 2 View ? ?Result Date: 04/16/2021 ?CLINICAL DATA:  Evaluate for fluid overload. EXAM: CHEST - 2 VIEW COMPARISON:  AP chest 12/18/2018 FINDINGS: Cardiac silhouette is at the upper limits  of normal size. Mediastinal contours are within normal limits. The lungs are clear. No pleural effusion or pneumothorax. No acute skeletal abnormality. IMPRESSION: No active cardiopulmonary disease. Electronically Signed   By: 06/16/2021 M.D.   On: 04/16/2021 17:47  ? ?CT HEAD WO CONTRAST ? ?Result Date: 04/16/2021 ?CLINICAL DATA:  Transient ischemic attack (TIA) EXAM: CT HEAD WITHOUT CONTRAST TECHNIQUE: Contiguous axial images were obtained from the base of the skull through the vertex without intravenous contrast. RADIATION DOSE REDUCTION: This exam was performed according to the departmental dose-optimization program which includes automated exposure control, adjustment of the mA and/or kV according to patient size and/or use of iterative reconstruction technique. COMPARISON:  2020 FINDINGS: Brain: There is no acute intracranial hemorrhage, mass effect, or edema. Deharo-white differentiation is preserved. Chronic infarct of the left basal ganglia and adjacent white matter. New age-indeterminate small right thalamic infarct. There is no extra-axial fluid collection. Ventricles and sulci are within normal limits in size and configuration. Vascular: No hyperdense vessel. Skull: Calvarium is unremarkable. Sinuses/Orbits: No acute finding. Other: None. IMPRESSION: No acute intracranial hemorrhage or definite acute  infarction. Age-indeterminate new small right thalamic infarct. Chronic infarct of the left basal ganglia and adjacent white matter. Electronically Signed   By: Guadlupe Spanish M.D.   On: 04/16/2021 18:01  ? ?MR ANGIO HEAD WO CONTRAST ? ?Result Date: 04/17/2021 ?CLINICAL DATA:  Initial evaluation for neuro deficit, stroke suspected. EXAM: MRI HEAD WITHOUT CONTRAST MRA HEAD WITHOUT CONTRAST MRA NECK WITHOUT CONTRAST TECHNIQUE: Multiplanar, multiecho pulse sequences of the brain and surrounding structures were obtained without intravenous contrast. Angiographic images of the Circle of Willis were obtained using  MRA technique without intravenous contrast. Angiographic images of the neck were obtained using MRA technique without intravenous contrast. Carotid stenosis measurements (when applicable) are obtained utilizing NASCET criteria, using the distal internal carotid diameter as the denominator. COMPARISON:  Prior study from 12/18/2018. FINDINGS: MRI HEAD FINDINGS Brain: Cerebral volume within normal limits. Moderate chronic microvascular ischemic disease noted involving the periventricular and deep white matter both cerebral hemispheres. Multiple scattered remote lacunar infarcts present about the deep Erkkila nuclei and left midbrain/pons. No evidence for acute or subacute infarct. Tal-white matter differentiation maintained. No acute intracranial hemorrhage. Few small chronic micro hemorrhages noted about the thalami, likely hypertensive in nature. No mass lesion or midline shift. No hydrocephalus or extra-axial fluid collection. Pituitary gland and suprasellar region normal. Vascular: Major intracranial vascular flow voids are maintained. Skull and upper cervical spine: Craniocervical junction normal. Bone marrow signal intensity diffusely decreased on T1 weighted sequence, nonspecific, but most commonly related to anemia, smoking or obesity. No scalp soft tissue abnormality. Sinuses/Orbits: Globes orbital soft tissues within normal limits. Scattered mucosal thickening noted within the ethmoidal air cells. Paranasal sinuses are otherwise clear. No mastoid effusion. Other: None. MRA HEAD FINDINGS ANTERIOR CIRCULATION: Examination moderately degraded by motion artifact. Visualized distal cervical segments of the internal carotid arteries are patent with antegrade flow. Petrous, cavernous, and supraclinoid segments patent without significant stenosis. A1 segments, anterior communicating artery complex, and anterior cerebral arteries patent bilaterally. No M1 stenosis or occlusion. No proximal MCA branch occlusion. Diffuse  atheromatous irregularity throughout the MCA branches bilaterally. POSTERIOR CIRCULATION: Both vertebral arteries patent without stenosis. Both PICA patent. Basilar patent without stenosis. Superior cerebral arteries patent bilaterally. Both PCAs primarily supplied via the basilar and are widely patent without proximal stenosis. No intracranial aneurysm. MRA NECK FINDINGS AORTIC ARCH: Examination degraded by motion artifact and lack of IV contrast. Visualized aortic arch grossly within normal limits for caliber with normal branch pattern. No stenosis about the origin the great vessels. RIGHT CAROTID SYSTEM: Right common and internal carotid arteries tortuous but patent without significant stenosis or evidence for dissection. No significant atheromatous narrowing about the right carotid bifurcation. LEFT CAROTID SYSTEM: Left common internal carotid arteries tortuous but patent without significant stenosis or evidence for dissection. No significant atheromatous narrowing about the left carotid bifurcation. VERTEBRAL ARTERIES: Both vertebral arteries arise from subclavian arteries. Vertebral arteries mildly tortuous but appear patent without significant stenosis or evidence for dissection. IMPRESSION: MRI HEAD IMPRESSION: 1. No acute intracranial abnormality. 2. Moderate chronic microvascular ischemic disease with multiple remote lacunar infarcts about the deep Catala nuclei and left midbrain/pons. MRA HEAD IMPRESSION: 1. Motion degraded exam. 2. Negative intracranial MRA for large vessel occlusion. 3. Diffuse small vessel atheromatous irregularity throughout the intracranial circulation. MRA NECK IMPRESSION: 1. Negative MRA of the neck with no hemodynamically significant stenosis or other acute finding. 2. Tortuosity of the major arterial vasculature of the neck, suggesting chronic underlying hypertension. Electronically Signed   By: Janell Quiet.D.  On: 04/17/2021 03:59  ? ?MR ANGIO NECK WO  CONTRAST ? ?Result Date: 04/17/2021 ?CLINICAL DATA:  Initial evaluation for neuro deficit, stroke suspected. EXAM: MRI HEAD WITHOUT CONTRAST MRA HEAD WITHOUT CONTRAST MRA NECK WITHOUT CONTRAST TECHNIQUE: Multiplanar, mu

## 2021-04-17 NOTE — H&P (Addendum)
?History and Physical  ? ? ?Patient: Jamie [name] [id number] DOB: April 12, 1975 ?DOA: 04/16/2021 ?DOS: the patient was seen and examined on 04/17/2021 ?PCP: Pcp, No  ?Patient coming from: Work via EMS ? ?Chief Complaint: Left lower leg weakness ? ?HPI: Jamie [name] is a 46 y.o. female with medical history significant of uncontrolled hypertension, CVA, and tobacco abuse presents with complaints of left lower leg weakness.  Last known normal was around 12 -12:30 PM yesterday while she was at work.  She noticed that more so her left foot was weak when she tried to stand up at around 1:30 PM. Denies having any numbness, headache, change in vision, slurred speech, fall, chest pain, or palpitations.  She chronically has lower extremity swelling, but reported that it had been worse here recently.  Patient states that she has been without her blood pressure medications for approximately 3 weeks.  It was multifactorial including losing her job, car issues, and then seemed to be doing okay without it so she did not restart them.  Patient reports having associated symptoms of urinary frequency with intermittent urinary incontinence for almost 1 year.  Denies having any discomfort with urinating, odor, or discharge.  Urinary incontinence has caused her to use adult briefs.  She reports that she has to be discharged today because she needs to go to work. ? ?In route with EMS patient was noted to have blood pressures elevated greater than 200. ? ?Upon admission into the emergency department patient was seen to be afebrile with blood pressures elevated up to 230/122, and all other vital signs maintained.   Doppler ultrasound of the lower extremities did not show any signs of a DVT. Neurology have been notified given patient's symptoms CT scan of the head noted no acute intercranial hemorrhage or definite acute infarct there was age-indeterminate new small right thalamic infarct noted.  She is not a thrombolytic candidate as  symptoms were noted to have been resolving.  Labs from 4/10 noted hemoglobin 10.2, MCV 76.4, MCH 22.7, RDW 16.5, calcium 8.6, i-STAT hCG 7.7, and BNP 37.4.   MRI brain along with MRA of the head and neck did not note any signs of an acute stroke or large vessel occlusion.  Urinalysis positive for nitrites and many bacteria.  UDS positive for marijuana.  Patient has been given Ativan 1 mg IV, Plavix, and labetalol 20 mg IV x1 dose.   ? ?Review of Systems: As mentioned in the history of present illness. All other systems reviewed and are negative. ?Past Medical History:  ?Diagnosis Date  ? Hypertension   ? Stroke [facility])   ? Tobacco use   ? ?Past Surgical History:  ?Procedure Laterality Date  ? WISDOM TOOTH EXTRACTION    ? ?Social History:  reports that she has been smoking cigarettes. She has been smoking an average of .5 packs per day. She has never used smokeless tobacco. She reports current drug use. Drug: Marijuana. She reports that she does not drink alcohol. ? ?No Known Allergies ? ?Family History  ?Problem Relation Age of Onset  ? Hypertension Mother   ? Diabetes Mother   ? ? ?Prior to Admission medications   ?Medication Sig Start Date End Date Taking? Authorizing Provider  ?clopidogrel (PLAVIX) 75 MG tablet Take 1 tablet (75 mg total) by mouth daily. 04/16/21  Yes Varney Biles, MD  ?aspirin 81 MG EC tablet Take 1 tablet (81 mg total) by mouth daily. 12/18/18   Black, Lezlie Octave, NP  ?atorvastatin (LIPITOR)  40 MG tablet Take 1 tablet (40 mg total) by mouth daily at 6 PM. 12/18/18   Black, Lezlie Octave, NP  ?hydrochlorothiazide (HYDRODIURIL) 12.5 MG tablet Take 1 tablet (12.5 mg total) by mouth daily. 12/18/18   Black, Lezlie Octave, NP  ?lisinopril (ZESTRIL) 20 MG tablet Take 1 tablet (20 mg total) by mouth daily. 12/18/18   Black, Lezlie Octave, NP  ?nicotine (NICODERM CQ - DOSED IN MG/24 HOURS) 21 mg/24hr patch Place 1 patch (21 mg total) onto the skin daily. 12/18/18   Black, Lezlie Octave, NP  ? ? ?Physical Exam: ?Vitals:  ?  04/16/21 2004 04/16/21 2114 04/17/21 0115 04/17/21 0435  ?BP: (!) 181/85 (!) 160/99 (!) 180/100 (!) 157/86  ?Pulse: 85 88 88 88  ?Resp: 17 16 16 17   ?Temp:    98.1 ?F (36.7 ?C)  ?TempSrc:    Oral  ?SpO2: 99% 100% 99% 99%  ? ?Constitutional: Obese female currently in no acute distress ?Eyes: PERRL, lids and conjunctivae normal ?ENMT: Mucous membranes are moist. Posterior pharynx clear of any exudate or lesions.   ?Neck: normal, supple, no masses, no thyromegaly ?Respiratory: clear to auscultation bilaterally, no wheezing, no crackles. Normal respiratory effort. No accessory muscle use.  ?Cardiovascular: Regular rate and rhythm, no murmurs / rubs / gallops.  1+ pitting extremity edema.   ?Abdomen: no tenderness bowel sounds present. ?Musculoskeletal: no clubbing / cyanosis. No joint deformity upper and lower extremities. Good ROM, no contractures. Normal muscle tone.  ?Skin: no rashes, lesions, ulcers. No induration ?Neurologic: CN 2-12 grossly intact.  Strength 5/5 in all 4.  ?Psychiatric: Normal judgment and insight. Alert and oriented x 3. Normal mood.  ? ?Data Reviewed: ? ?EKG noted normal sinus rhythm at 74 bpm.  Reviewed labs, imaging, and pertinent records as noted above in HPI. ? ?Assessment and Plan: ?* TIA (transient ischemic attack) ?Patient presented with transient left leg weakness.  She had not been a thrombolytic candidate due to resolution of symptoms. MRI along with MRA of the head and neck did not show any signs of a acute stroke or large vessel occlusion.  Plan was admission for completion of stroke work-up.  Patient was started on aspirin and Plavix ?-Admit to a telemetry bed ?-Stroke order set utilized ?-Neurochecks ?-Check lipid panel and hemoglobin A1c ?-Check echocardiogram ?-PT/OT/speech ?-Aspirin, Plavix, statin ?-Appreciate neurology consultative services, we will follow-up for any further recommendations ? ?Hypertensive urgency ?Upon admission into the hospital patient was noted to have  blood pressures elevated up to 230/122.  She did stop taking her home blood pressure medications due to cost.  Home blood pressure regimen included lisinopril 20 mg daily and hydrochlorothiazide 12.5 mg daily.  Neurology recommended to allow for permissive hypertension, and to resume home blood pressure medications if MRI negative. ?-Resume lisinopril and hydrochlorothiazide ?-Hydralazine IV as needed for systolic blood pressures greater than 99991111 or diastolics greater than A999333 ? ?Urinary tract infection urinary frequency ?Urinalysis was positive for nitrites and many bacteria. ?-Check urine culture ?-Fosfomycin 1 g IV ? ?Hyperlipidemia ?-Follow-up lipid panel ?-Continue atorvastatin ? ?Lower extremity edema ?Acute on chronic.  Patient reports chronically having lower extremity swelling.  Doppler ultrasound of the lower extremities was negative for any signs of a DVT. ?-Recommended patient to elevate legs at night and suggest use of compression stockings ? ?History of CVA (cerebrovascular accident) ?Patient reports previously having a stroke in 2019, but denies having any residual deficits. ?-Aspirin, statin, Plavix ? ?Microcytic hypochromic anemia ?Chronic.  Hemoglobin 10.2 g/dL  with MCV 76.4, MCH 22.7, and RDW 16.5 which is suggestive of iron deficiency. ?-Recommend iron supplementation ? ? ?Elevated beta-hCG ?Acute.  Beta-hCG by i-STAT  7.7. ?-Check urine pregnancy ? ?Advance Care Planning:   Code Status: Full Code  ? ?Consults: Neurology ? ?Family Communication: None requested ? ?Severity of Illness: ?The appropriate patient status for this patient is OBSERVATION. Observation status is judged to be reasonable and necessary in order to provide the required intensity of service to ensure the patient's safety. The patient's presenting symptoms, physical exam findings, and initial radiographic and laboratory data in the context of their medical condition is felt to place them at decreased risk for further clinical  deterioration. Furthermore, it is anticipated that the patient will be medically stable for discharge from the hospital within 2 midnights of admission.  ? ?Author: ?Norval Morton, MD ?04/17/2021 7:13 AM ? ?For

## 2021-04-17 NOTE — ED Notes (Signed)
Urine cup given to patient, instructed that urine specimen is needed. Pt refused Covid swab. ?

## 2021-04-17 NOTE — Assessment & Plan Note (Signed)
-  Follow-up lipid panel ?-Continue atorvastatin ?

## 2021-04-17 NOTE — ED Notes (Signed)
Pt remains in MRI at this time  

## 2021-04-17 NOTE — Discharge Summary (Signed)
?Physician Discharge Summary ?  ?Patient: Jamie [name] MRN: [id number] DOB: 12/27/1975  ?Admit date:     04/16/2021  ?Discharge date: 04/17/21  ?Discharge Physician: Clydie Braunondell A Jahree Dermody  ? ?PCP: [facility] ? ?Recommendations at discharge:  ? ?Follow-up urinary frequency resolved. ?Ens[facility] ? ?Discharge Diagnoses: ?Principal Problem: ?  TIA (transient ischemic attack) ?Active Problems: ?  Hypertensive urgency ?  Urinary tract infection urinary frequency ?  Tobacco use ?  Microcytic hypochromic anemia ?  History of CVA (cerebrovascular accident) ?  Urinary frequency ?  Lower extremity edema ?  Hyperlipidemia ? ? ? ?Hospital Course: ? ?Jamie [name] is a 46 y.o. female with medical history significant of uncontrolled hypertension, CVA, and tobacco abuse presents with complaints of left lower leg weakness.  Last known normal was around 12 -12:30 PM yesterday while she was at work.  She noticed that more so her left foot was weak when she tried to stand up at around 1:30 PM. Denies having any numbness, headache, change in vision, slurred speech, fall, chest pain, or palpitations.  She chronically has lower extremity swelling, but reported that it had been worse here recently.  Patient states that she has been without her blood pressure medications for approximately 3 weeks.  It was multifactorial including losing her job, car issues, and then seemed to be doing okay without it so she did not restart them.  Patient reports having associated symptoms of urinary frequency with intermittent urinary incontinence for almost 1 year.  Denies having any discomfort with urinating, odor, or discharge.  Urinary incontinence has caused her to use adult briefs.  She reports that she has to be discharged today because she needs to go to work. ?  ?In route with EMS patient was noted to have blood pressures elevated greater than 200. ?  ?Upon admission into the  emergency department patient was seen to be afebrile with blood pressures elevated up to 230/122, and all other vital signs maintained.   Doppler ultrasound of the lower extremities did not show any signs of a DVT. Neurology have been notified given patient's symptoms CT scan of the head noted no acute intercranial hemorrhage or definite acute infarct there was age-indeterminate new small right thalamic infarct noted.  She is not a thrombolytic candidate as symptoms were noted to have been resolving.  Labs from 4/10 noted hemoglobin 10.2, MCV 76.4, MCH 22.7, RDW 16.5, calcium 8.6, i-STAT hCG 7.7, and BNP 37.4.   MRI brain along with MRA of the head and neck did not note any signs of an acute stroke or large vessel occlusion.  Urinalysis positive for nitrites and many bacteria.  UDS positive for marijuana.  Patient has been given Ativan 1 mg IV, Plavix, and labetalol 20 mg IV x1 dose.   ? ?* TIA (transient ischemic attack) ?Patient presented with transient left leg weakness.  She had not been a thrombolytic candidate due to resolution of symptoms. MRI along with MRA of the head and neck did not show any signs of a acute stroke or large vessel occlusion.  Plan was admission for completion of stroke work-up.  Patient was given aspirin and Plavix while in the ED.  Neurology recommended patient be on dual antiplatelet therapy with aspirin and Plavix Plavix for 21 days, then continue aspirin.  Patient's hemoglobin A1c was 5.2 and LDL 132.  [facility] 2 months. ? ? ?Hypertensive urgency ?  Upon admission into the hospital patient was noted to have blood pressures elevated up to 230/122.  She had stopped taking her home blood pressure medications about 3 weeks ago and noted that it was multifactorial in addition because.  Home blood pressure regimen included lisinopril 20 mg daily and hydrochlorothiazide 12.5 mg daily.  Neurology recommended to allow for permissive  hypertension, and to resume home blood pressure medications if MRI negative.  Home medications of lisinopril and hydrochlorothiazide were resumed after MRI was noted to be negative.  Patient medications for at least 1 month were provided by the transitions of care pharmacy and given to the patient prior to discharge. ? ?Urinary tract infection urinary frequency ?Patient complained of urinary frequency.  Urinalysis was positive for nitrites and many bacteria.  Patient was given fosfomycin 1 g IV and she requested discharge.  Urine culture was collected and revealed greater than 100,000 E. coli. ? ?Hyperlipidemia ?Home medication regimen included atorvastatin daily.  Lipid panel revealed LDL 132.  Goal LDL to be less than 70.  Atorvastatin was continued at discharge, but primary should consider increasing to 80 mg. ? ?Lower extremity edema ?Acute on chronic.  Patient reports chronically having lower extremity swelling.  Doppler ultrasound of the lower extremities was negative for any signs of a DVT.  Patient compression stockings. ? ?History of CVA (cerebrovascular accident) ?Patient reports previously having a stroke in 2019, but denies having any residual deficits.  Continue aspirin, Plavix (for the 21 days), and statin. ? ?Microcytic hypochromic anemia ?Chronic.  Hemoglobin 10.2 g/dL with MCV 85.0, MCH 27.7, and RDW 16.5 which is suggestive of iron deficiency. ?-Recommend iron supplementation ? ?Elevated beta-hCG ?Patient notes that she has had her tubes tied and therefore urine pregnancy screen was not warranted. ? ?  ? ? ?Consultants: Neurology ?Procedures performed: None ?Disposition: Home ?Diet recommendation:  ?Discharge Diet Orders (From admission, onward)  ? ?  Start     Ordered  ? 04/17/21 0000  Diet - low sodium heart healthy       ? 04/17/21 0911  ? ?  ?  ? ?  ? ? ?DISCHARGE MEDICATION: ?Allergies as of 04/17/2021   ?No Known Allergies ?  ? ?  ?Medication List  ?  ? ?TAKE these medications   ? ?Aspirin  Low Dose 81 MG EC tablet ?Generic drug: aspirin ?Take 1 tablet (81 mg total) by mouth daily. ?  ?atorvastatin 40 MG tablet ?Commonly known as: LIPITOR ?Take 1 tablet (40 mg total) by mouth daily at 6 PM. ?  ?clopidogrel 75 MG tablet ?Commonly known as: PLAVIX ?Take 1 tablet (75 mg total) by mouth daily. ?  ?hydrochlorothiazide 12.5 MG tablet ?Commonly known as: HYDRODIURIL ?Take 1 tablet (12.5 mg total) by mouth daily. ?  ?lisinopril 20 MG tablet ?Commonly known as: ZESTRIL ?Take 1 tablet (20 mg total) by mouth daily. ?  ?nicotine 21 mg/24hr patch ?Commonly known as: NICODERM CQ - dosed in mg/24 hours ?Place 1 patch (21 mg total) onto the skin daily. ?  ? ?  ? ?  ?  ? ? ?  ?Durable Medical Equipment  ?(From admission, onward)  ?  ? ? ?  ? ?  Start     Ordered  ? 04/17/21 1459  For home use only DME Cane  Once       ? 04/17/21 1458  ? ?  ?  ? ?  ? ? Follow-up Information   ? ? [facility].  Call in 1 week(s).   ?Specialty: Family Medicine ?Contact information: ?[address] ?[address] ?[place] [address] ?[phone] ? ? ?  ?  ? ? [facility]. Schedule an appointment as soon as possible for a visit in 2 month(s).   ?Contact information: ?[address]     [address] ?[place] [address] ?[phone] ? ?  ?  ? ?  ?  ? ?  ? ?Discharge Exam: ?Today's Vitals  ? 04/17/21 0957 04/17/21 1001 04/17/21 1431 04/17/21 1509  ?BP: (!) 172/99 (!) 172/99 (!) 167/98   ?Pulse:  82 80   ?Resp:  18 18   ?Temp:  98.1 ?F (36.7 ?C) 98.1 ?F (36.7 ?C)   ?TempSrc:  Oral    ?SpO2:  99% 100%   ?PainSc:    0-No pain  ? ?Constitutional: Obese female currently in no acute distress ?Eyes: PERRL, lids and conjunctivae normal ?ENMT: Mucous membranes are moist. Posterior pharynx clear of any exudate or lesions.   ?Neck: normal, supple, no masses, no thyromegaly ?Respiratory: clear to auscultation bilaterally, no wheezing, no crackles. Normal respiratory effort. No  accessory muscle use.  ?Cardiovascular: Regular rate and rhythm, no murmurs / rubs / gallops.  1+ pitting extremity edema.   ?Abdomen: no tenderness bowel sounds present. ?Musculoskeletal: no clubbing / cyanosis. No join

## 2021-04-17 NOTE — Assessment & Plan Note (Signed)
Upon admission into the hospital patient was noted to have blood pressures elevated up to 230/122.  She did stop taking her home blood pressure medications due to cost.  Home blood pressure regimen included lisinopril 20 mg daily and hydrochlorothiazide 12.5 mg daily.  Neurology recommended to allow for permissive hypertension, and to resume home blood pressure medications if MRI negative. ?-Resume lisinopril and hydrochlorothiazide ?-Hydralazine IV as needed for systolic blood pressures greater than 180 or diastolics greater than 110 ?

## 2021-04-17 NOTE — Assessment & Plan Note (Signed)
Chronic.  Hemoglobin 10.2 g/dL with MCV 49.4, MCH 49.6, and RDW 16.5 which is suggestive of iron deficiency. ?-Recommend iron supplementation ?

## 2021-04-17 NOTE — ED Notes (Signed)
Pt given her bag of prescribed medications and confirmed with pt. ?

## 2021-04-18 NOTE — TOC CAGE-AID Note (Signed)
Transition of Care (TOC) - CAGE-AID Screening ? ? ?Patient Details  ?Name: Jamie [name] ?MRN: [id number] ?Date of Birth: Feb 09, 1975 ? ?Transition of Care (TOC) CM/SW Contact:    ?Silvie Obremski C Tarpley-Carter, LCSWA ?Phone Number: ?04/18/2021, 12:41 PM ? ? ?Clinical Narrative: ?Pt participated in Cage-Aid.  Pt stated she does smoke cigarettes and marijuana.  Pt was offered resources, due to usage of substance.    ? ?Insurance underwriter, MSW, LCSW-A ?Pronouns:  She/Her/Hers ?[facility] ?Clinical Social Worker ?Direct Number:  [phone] ?[email or web address] ? ?CAGE-AID Screening: ?  ? ?Have You Ever Felt You Ought to Cut Down on Your Drinking or Drug Use?: No ?Have People Annoyed You By Critizing Your Drinking Or Drug Use?: No ?Have You Felt Bad Or Guilty About Your Drinking Or Drug Use?: No ?Have You Ever Had a Drink or Used Drugs First Thing In The Morning to Steady Your Nerves or to Get Rid of a Hangover?: No ?CAGE-AID Score: 0 ? ?Substance Abuse Education Offered: Yes ? ?Substance abuse interventions: Educational Materials ? ? ? ? ? ? ?

## 2021-04-19 LAB — URINE CULTURE: Culture: >=100000 — AB

## 2021-04-20 ENCOUNTER — Telehealth: Payer: Self-pay

## 2021-04-20 NOTE — Telephone Encounter (Signed)
Post ED Visit - Positive Culture Follow-up ? ?Culture report reviewed by antimicrobial stewardship pharmacist: ?[facility] ?[x]  , Pharm.D. ?[]  Enos Fling, Pharm.D., BCPS AQ-ID ?[]  , Pharm.D., BCPS ?[]  Celedonio Miyamoto, Pharm.D., BCPS ?[]  , Garvin Fila.D., BCPS, AAHIVP ?[]  , Pharm.D., BCPS, AAHIVP ?[]  Georgina Pillion, PharmD, BCPS ?[]  , PharmD, BCPS ?[]  [address], PharmD, BCPS ?[]  [address], PharmD ?[]  , PharmD, BCPS ?[]  Estella Husk, PharmD ? ? [facility] ?[]  Lysle Pearl, PharmD ?[]  , PharmD ?[]  Phillips Climes, PharmD ?[]  , Rph ?[]  Agapito Games) , PharmD ?[]  Verlan Friends, PharmD ?[]  , PharmD ?[]  Mervyn Gay, PharmD ?[]  , PharmD ?[]  Vinnie Level, PharmD ?[]  Gerri Spore, PharmD ?[]  , PharmD ?[]  Len Childs, PharmD ? ? ?Positive urine culture ?Treated with Fosfomycin, organism sensitive to the same and no further patient follow-up is required at this time. ? ? ?04/20/2021, 9:59 AM ?  ?
